# Patient Record
Sex: Male | Born: 1955
Health system: Southern US, Community
[De-identification: ages and names within clinical notes are randomized; demographics above are authoritative.]

## PROBLEM LIST (undated history)

## (undated) DIAGNOSIS — I82409 Acute embolism and thrombosis of unspecified deep veins of unspecified lower extremity: Secondary | ICD-10-CM

## (undated) DIAGNOSIS — I499 Cardiac arrhythmia, unspecified: Secondary | ICD-10-CM

## (undated) DIAGNOSIS — K2270 Barrett's esophagus without dysplasia: Secondary | ICD-10-CM

## (undated) DIAGNOSIS — E7800 Pure hypercholesterolemia, unspecified: Secondary | ICD-10-CM

## (undated) DIAGNOSIS — I2699 Other pulmonary embolism without acute cor pulmonale: Secondary | ICD-10-CM

## (undated) DIAGNOSIS — K219 Gastro-esophageal reflux disease without esophagitis: Secondary | ICD-10-CM

## (undated) DIAGNOSIS — K449 Diaphragmatic hernia without obstruction or gangrene: Secondary | ICD-10-CM

## (undated) DIAGNOSIS — M1990 Unspecified osteoarthritis, unspecified site: Secondary | ICD-10-CM

## (undated) HISTORY — PX: LASIK: SHX215

## (undated) HISTORY — DX: Diaphragmatic hernia without obstruction or gangrene: K44.9

## (undated) HISTORY — PX: FRACTURE SURGERY: SHX138

## (undated) HISTORY — PX: SHOULDER ARTHROSCOPY: SHX128

## (undated) HISTORY — DX: Barrett's esophagus without dysplasia: K22.70

## (undated) HISTORY — PX: TOTAL KNEE ARTHROPLASTY: SHX125

## (undated) HISTORY — DX: Unspecified osteoarthritis, unspecified site: M19.90

## (undated) HISTORY — PX: KNEE ARTHROSCOPY: SUR90

## (undated) HISTORY — PX: TOE AMPUTATION: SHX809

---

## 2000-03-23 ENCOUNTER — Encounter: Payer: Self-pay | Admitting: Internal Medicine

## 2000-04-24 ENCOUNTER — Encounter (INDEPENDENT_AMBULATORY_CARE_PROVIDER_SITE_OTHER): Payer: Self-pay | Admitting: *Deleted

## 2000-04-24 ENCOUNTER — Ambulatory Visit (HOSPITAL_COMMUNITY): Admission: RE | Admit: 2000-04-24 | Discharge: 2000-04-24 | Payer: Self-pay | Admitting: *Deleted

## 2000-05-17 ENCOUNTER — Encounter: Payer: Self-pay | Admitting: Internal Medicine

## 2000-05-18 ENCOUNTER — Ambulatory Visit (HOSPITAL_COMMUNITY)
Admission: RE | Admit: 2000-05-18 | Discharge: 2000-05-18 | Payer: Self-pay | Admitting: Physical Medicine and Rehabilitation

## 2000-05-18 ENCOUNTER — Encounter: Payer: Self-pay | Admitting: Physical Medicine and Rehabilitation

## 2001-02-12 ENCOUNTER — Emergency Department (HOSPITAL_COMMUNITY): Admission: EM | Admit: 2001-02-12 | Discharge: 2001-02-12 | Payer: Self-pay | Admitting: Emergency Medicine

## 2001-10-05 ENCOUNTER — Emergency Department (HOSPITAL_COMMUNITY): Admission: AC | Admit: 2001-10-05 | Discharge: 2001-10-06 | Payer: Self-pay

## 2001-10-06 ENCOUNTER — Encounter: Payer: Self-pay | Admitting: Emergency Medicine

## 2001-11-25 ENCOUNTER — Encounter: Payer: Self-pay | Admitting: Internal Medicine

## 2001-12-11 ENCOUNTER — Ambulatory Visit (HOSPITAL_COMMUNITY): Admission: RE | Admit: 2001-12-11 | Discharge: 2001-12-11 | Payer: Self-pay | Admitting: *Deleted

## 2001-12-11 ENCOUNTER — Encounter (INDEPENDENT_AMBULATORY_CARE_PROVIDER_SITE_OTHER): Payer: Self-pay | Admitting: *Deleted

## 2001-12-11 ENCOUNTER — Encounter (INDEPENDENT_AMBULATORY_CARE_PROVIDER_SITE_OTHER): Payer: Self-pay | Admitting: Specialist

## 2002-07-08 ENCOUNTER — Encounter: Payer: Self-pay | Admitting: Internal Medicine

## 2006-02-22 ENCOUNTER — Ambulatory Visit: Payer: Self-pay | Admitting: Family Medicine

## 2006-03-12 ENCOUNTER — Ambulatory Visit: Payer: Self-pay | Admitting: Family Medicine

## 2006-03-28 ENCOUNTER — Ambulatory Visit: Payer: Self-pay | Admitting: Cardiovascular Disease

## 2006-04-02 ENCOUNTER — Encounter: Payer: Self-pay | Admitting: Internal Medicine

## 2006-04-27 ENCOUNTER — Encounter (INDEPENDENT_AMBULATORY_CARE_PROVIDER_SITE_OTHER): Payer: Self-pay | Admitting: Specialist

## 2006-04-27 ENCOUNTER — Ambulatory Visit (HOSPITAL_COMMUNITY): Admission: RE | Admit: 2006-04-27 | Discharge: 2006-04-27 | Payer: Self-pay | Admitting: *Deleted

## 2006-04-27 ENCOUNTER — Encounter (INDEPENDENT_AMBULATORY_CARE_PROVIDER_SITE_OTHER): Payer: Self-pay | Admitting: *Deleted

## 2006-05-14 ENCOUNTER — Ambulatory Visit: Payer: Self-pay | Admitting: Cardiovascular Disease

## 2007-08-08 ENCOUNTER — Ambulatory Visit: Payer: Self-pay | Admitting: Family Medicine

## 2007-09-12 ENCOUNTER — Ambulatory Visit: Payer: Self-pay | Admitting: Family Medicine

## 2007-09-18 ENCOUNTER — Ambulatory Visit: Payer: Self-pay | Admitting: Family Medicine

## 2009-02-03 ENCOUNTER — Ambulatory Visit: Payer: Self-pay | Admitting: Family Medicine

## 2009-05-10 ENCOUNTER — Ambulatory Visit: Payer: Self-pay | Admitting: Family Medicine

## 2009-05-13 ENCOUNTER — Ambulatory Visit: Payer: Self-pay | Admitting: Family Medicine

## 2009-05-13 ENCOUNTER — Encounter: Admission: RE | Admit: 2009-05-13 | Discharge: 2009-05-13 | Payer: Self-pay | Admitting: Family Medicine

## 2009-08-16 ENCOUNTER — Ambulatory Visit: Payer: Self-pay | Admitting: Family Medicine

## 2010-03-23 ENCOUNTER — Ambulatory Visit: Payer: Self-pay | Admitting: Family Medicine

## 2010-04-13 ENCOUNTER — Encounter (INDEPENDENT_AMBULATORY_CARE_PROVIDER_SITE_OTHER): Payer: Self-pay | Admitting: *Deleted

## 2010-04-19 ENCOUNTER — Encounter: Payer: Self-pay | Admitting: Internal Medicine

## 2010-04-19 ENCOUNTER — Ambulatory Visit
Admission: RE | Admit: 2010-04-19 | Discharge: 2010-04-19 | Payer: Self-pay | Source: Home / Self Care | Attending: Family Medicine | Admitting: Family Medicine

## 2010-04-19 ENCOUNTER — Encounter (INDEPENDENT_AMBULATORY_CARE_PROVIDER_SITE_OTHER): Payer: Self-pay | Admitting: *Deleted

## 2010-04-20 DIAGNOSIS — R109 Unspecified abdominal pain: Secondary | ICD-10-CM | POA: Insufficient documentation

## 2010-04-20 DIAGNOSIS — K219 Gastro-esophageal reflux disease without esophagitis: Secondary | ICD-10-CM | POA: Insufficient documentation

## 2010-04-20 DIAGNOSIS — K648 Other hemorrhoids: Secondary | ICD-10-CM | POA: Insufficient documentation

## 2010-04-20 DIAGNOSIS — K2270 Barrett's esophagus without dysplasia: Secondary | ICD-10-CM | POA: Insufficient documentation

## 2010-04-20 DIAGNOSIS — R197 Diarrhea, unspecified: Secondary | ICD-10-CM | POA: Insufficient documentation

## 2010-04-21 ENCOUNTER — Ambulatory Visit
Admission: RE | Admit: 2010-04-21 | Discharge: 2010-04-21 | Payer: Self-pay | Source: Home / Self Care | Attending: Internal Medicine | Admitting: Internal Medicine

## 2010-04-21 ENCOUNTER — Encounter: Payer: Self-pay | Admitting: Internal Medicine

## 2010-04-21 DIAGNOSIS — R7401 Elevation of levels of liver transaminase levels: Secondary | ICD-10-CM | POA: Insufficient documentation

## 2010-04-21 DIAGNOSIS — R740 Nonspecific elevation of levels of transaminase and lactic acid dehydrogenase [LDH]: Secondary | ICD-10-CM

## 2010-04-21 DIAGNOSIS — M129 Arthropathy, unspecified: Secondary | ICD-10-CM | POA: Insufficient documentation

## 2010-04-26 ENCOUNTER — Ambulatory Visit
Admission: RE | Admit: 2010-04-26 | Discharge: 2010-04-26 | Payer: Self-pay | Source: Home / Self Care | Attending: Internal Medicine | Admitting: Internal Medicine

## 2010-04-26 ENCOUNTER — Encounter: Payer: Self-pay | Admitting: Internal Medicine

## 2010-04-26 ENCOUNTER — Other Ambulatory Visit: Payer: Self-pay | Admitting: Internal Medicine

## 2010-04-26 LAB — IGA: IgA: 371 mg/dL (ref 68–378)

## 2010-04-27 ENCOUNTER — Telehealth: Payer: Self-pay | Admitting: Internal Medicine

## 2010-04-27 ENCOUNTER — Telehealth (INDEPENDENT_AMBULATORY_CARE_PROVIDER_SITE_OTHER): Payer: Self-pay | Admitting: *Deleted

## 2010-04-29 ENCOUNTER — Telehealth: Payer: Self-pay | Admitting: Internal Medicine

## 2010-05-02 ENCOUNTER — Encounter: Payer: Self-pay | Admitting: Internal Medicine

## 2010-05-02 ENCOUNTER — Telehealth: Payer: Self-pay | Admitting: Internal Medicine

## 2010-05-03 ENCOUNTER — Telehealth (INDEPENDENT_AMBULATORY_CARE_PROVIDER_SITE_OTHER): Payer: Self-pay | Admitting: *Deleted

## 2010-05-04 ENCOUNTER — Ambulatory Visit
Admission: RE | Admit: 2010-05-04 | Discharge: 2010-05-04 | Payer: Self-pay | Source: Home / Self Care | Attending: Internal Medicine | Admitting: Internal Medicine

## 2010-05-06 ENCOUNTER — Encounter: Payer: Self-pay | Admitting: Internal Medicine

## 2010-05-09 ENCOUNTER — Ambulatory Visit: Admit: 2010-05-09 | Payer: Self-pay | Admitting: Internal Medicine

## 2010-05-11 ENCOUNTER — Telehealth: Payer: Self-pay | Admitting: Internal Medicine

## 2010-05-11 ENCOUNTER — Ambulatory Visit (HOSPITAL_COMMUNITY)
Admission: RE | Admit: 2010-05-11 | Discharge: 2010-05-11 | Payer: Self-pay | Source: Home / Self Care | Attending: Internal Medicine | Admitting: Internal Medicine

## 2010-05-13 ENCOUNTER — Other Ambulatory Visit: Payer: Self-pay | Admitting: Internal Medicine

## 2010-05-13 ENCOUNTER — Ambulatory Visit
Admission: RE | Admit: 2010-05-13 | Discharge: 2010-05-13 | Payer: Self-pay | Source: Home / Self Care | Attending: Internal Medicine | Admitting: Internal Medicine

## 2010-05-19 NOTE — Op Note (Signed)
Summary: EGD/bx                    [facility]  Patient:    Russell Sanford, Russell Sanford                        MRN: [id number] Proc. Date: 04/24/00 Adm. Date:  [id number] Attending:  Sabino Gasser CC:         Ronnald Nian, M.D.             Benny Lennert, M.D.                           Procedure Report  PROCEDURE PERFORMED:  Upper endoscopy.  INDICATIONS:  Reflux symptomatology.  See my clinical note dated March 23, 2000.  DESCRIPTION OF PROCEDURE:   With the patient mildly sedated in the left lateral decubitus position, the Olympus videoscopic endoscope was inserted into the mouth, passed under direct vision through the esophagus.  Distal esophagus was approached and showed a hiatal hernia and Barretts esophagus with esophagitis seen as evidenced by flames of erythematous tissue extending cephalad into the esophagus.  This was photographed and multiple biopsies were taken.  We entered into the stomach through the hiatal hernia sac.  Fundus body, antrum, duodenal bulb, second portion of the duodenum were all well visualized and appeared normal.  Photographs were taken.  From this point, the endoscope was slowly withdrawn, taking circumferential views of the entire duodenal mucosa until the endoscope was pulled back into the stomach, placed on retroflexion to view the stomach from below and a hiatal hernia was seen from below and photographed.  This is evidenced by incomplete wrap of the GE junction around the endoscope.  The endoscope was then straightened and withdrawn, taking circumferential views of the entire gastric and subsequently esophageal mucosa which otherwise appeared normal.  The patients vital signs and pulse oximetry remained stable.  The patient tolerated the procedure well with no apparent complications.  FINDINGS:  Hiatal hernia and Barretts esophagus above this, biopsied.  Await biopsy report.  PLAN:  The patient states he continues to have symptoms  on Nexium of belching and reflux of material; however, it is less acidic and less noxious on medications and will plan on continuing that and discuss this further with him when we follow up as an outpatient.  Proceed to colonoscopy as planned. DD:  04/24/00 TD:  04/24/00 Job: [id number]      FINAL DIAGNOSIS    ***MICROSCOPIC EXAMINATION AND DIAGNOSIS***    I. ESOPHAGUS: SQUAMOUS AND GASTRIC CARDIA MUCOSA WITH MILD TO   MODERATE CHRONIC INFLAMMATION (BIOPSY OF ESOPHAGOGASTRIC   JUNCTION). NO INTESTINAL METAPLASIA, DYSPLASIA OR MALIGNANCY   IDENTIFIED.   II. SMALL BOWEL BIOPSY: UNREMARKABLE SMALL BOWEL MUCOSA. NO   VILLOUS ATROPHY, INFLAMMATION OR OTHER ABNORMALITIES PRESENT.   (BIOPSY, TERMINAL ILIEUM)    III. COLON, BIOPSY: UNREMARKABLE COLONIC MUCOSA. NO SIGNIFICANT   INFLAMMATION OR OTHER ABNORMALITIES IDENTIFIED. (BIOPSIES,   RANDOM)    JO ANN SHAW MD    COMMENT   I. An Alcian Blue stain is performed to determine the presence   of intestinal metaplasia in this surgical pathology material. No   intestinal metaplasia is identified with Alcian Blue stain. The   control stained appropriately. There is benign gastric cardia and   a small amount of benign squamous mucosa which show benign   reactive changes but is  otherwise unremarkable. Mild to   moderate chronic inflammation is present in the submucosa. The   findings may indicate mild esophagogastritis but clinical   correlation is recommended. JAS:MW 2-9    II. There is small bowel mucosa with normal villous architecture   and no objective increase in inflammation. No villous atrophy,   active inflammation or other significant changes identified.    III. There is colonic mucosa with normal crypt architecture and   no objective increase in inflammation. No active inflammation,   microscopic colitis, collagenous colitis or significant chronic   changes identified. No hyperplastic or adenomatous changes are   seen,  and there is no evidence of malignancy.    mw   Date Reported: 04/25/2000 Berneta Levins, MD   *** Electronically Signed Out By JAS ***    Clinical information   R/O BARRETT' S (JC)    specimen(s) obtained   1: EG JUNCTION   2: TERMINAL ILEUM   3: RANDOM COLON    Gross Description   I. Received in formalin is a tan, soft tissue fragment that is   submitted in toto. Size: 0.3 cm    II. Received in formalin are tan, soft tissue fragments that are   submitted in toto. Number: 2   Size: 0.3 cm    III. Received in formalin are tan, soft tissue fragments that   are submitted in toto. Number: 5   Size: 0.1 to 0.3 cm (GP:gt) 04/24/00    gdt/

## 2010-05-19 NOTE — Letter (Signed)
Summary: [facility]   Imported By: Sherian Rein 05/06/2010 15:09:43  _____________________________________________________________________  External Attachment:    Type:   Image     Comment:   External Document

## 2010-05-19 NOTE — Progress Notes (Signed)
Summary: Questions  Phone Note Call from Patient Call back at Home Phone [phone] Call back at [phone]   Caller: Patient Call For: Dr. Juanda Chance Reason for Call: Talk to Nurse Summary of Call: Pt wants to speak with [facility] about lab results and also has other questions Initial call taken by: [place] Johnson,  April 29, 2010 12:10 PM  Follow-up for Phone Call        Patient calling to see if his path results are back yet. He will call back Monday if he has not heard from [place].  Follow-up by: Jesse Fall RN,  April 29, 2010 1:51 PM     Appended Document: Questions I have spoken to the pt. awaiting biopsy results.

## 2010-05-19 NOTE — Letter (Signed)
Summary: [facility]   Imported By: Sherian Rein 05/06/2010 15:12:06  _____________________________________________________________________  External Attachment:    Type:   Image     Comment:   External Document

## 2010-05-19 NOTE — Letter (Signed)
Summary: [facility] Instructions  [facility]  [address] [place], [place] [address]   Phone: [phone]  Fax: [phone]       JILBERTO VANDERWALL    [birth year]    MRN: [id number]       Procedure Day /Date: Tuesday 04/26/10     Arrival Time: 1:30 pm     Procedure Time: 2:30 pm     Location of Procedure:                    _x _  [facility] (4th Floor)  PREPARATION FOR COLONOSCOPY WITH MIRALAX  Starting 5 days prior to your procedure 04/21/10 do not eat nuts, seeds, popcorn, corn, beans, peas,  salads, or any raw vegetables.  Do not take any fiber supplements (e.g. Metamucil, Citrucel, and Benefiber). ____________________________________________________________________________________________________   THE DAY BEFORE YOUR PROCEDURE         DATE: 04/25/10 DAY: Monday  1   Drink clear liquids the entire day-NO SOLID FOOD  2   Do not drink anything colored red or purple.  Avoid juices with pulp.  No orange juice.  3   Drink at least 64 oz. (8 glasses) of fluid/clear liquids during the day to prevent dehydration and help the prep work efficiently.  CLEAR LIQUIDS INCLUDE: Water Jello Ice Popsicles Tea (sugar ok, no milk/cream) Powdered fruit flavored drinks Coffee (sugar ok, no milk/cream) Gatorade Juice: apple, white grape, white cranberry  Lemonade Clear bullion, consomm, broth Carbonated beverages (any kind) Strained chicken noodle soup Hard Candy  4   Mix the entire bottle of Miralax with 64 oz. of Gatorade/Powerade in the morning and put in the refrigerator to chill.  5   At 3:00 pm take 2 Dulcolax/Bisacodyl tablets.  6   At 4:30 pm take one Reglan/Metoclopramide tablet.  7  Starting at 5:00 pm drink one 8 oz glass of the Miralax mixture every 15-20 minutes until you have finished drinking the entire 64 oz.  You should finish drinking prep around 7:30 or 8:00 pm.  8   If you are nauseated, you may take the 2nd Reglan/Metoclopramide tablet at  6:30 pm.        9    At 8:00 pm take 2 more DULCOLAX/Bisacodyl tablets.        THE DAY OF YOUR PROCEDURE      DATE:  04/26/10 DAY: Tuesday  You may drink clear liquids until  12:30 pm (2 HOURS BEFORE PROCEDURE).   MEDICATION INSTRUCTIONS  Unless otherwise instructed, you should take regular prescription medications with a small sip of water as early as possible the morning of your procedure.       OTHER INSTRUCTIONS  You will need a responsible adult at least 57 years of age to accompany you and drive you home.   This person must remain in the waiting room during your procedure.  Wear loose fitting clothing that is easily removed.  Leave jewelry and other valuables at home.  However, you may wish to bring a book to read or an iPod/MP3 player to listen to music as you wait for your procedure to start.  Remove all body piercing jewelry and leave at home.  Total time from sign-in until discharge is approximately 2-3 hours.  You should go home directly after your procedure and rest.  You can resume normal activities the day after your procedure.  The day of your procedure you should not:   Drive   Make legal  decisions   Operate machinery   Drink alcohol   Return to work  You will receive specific instructions about eating, activities and medications before you leave.   The above instructions have been reviewed and explained to me by   _______________________    I fully understand and can verbalize these instructions _____________________________ Date _______

## 2010-05-19 NOTE — Assessment & Plan Note (Signed)
Summary: APPT AT 10:45AM/YF   History of Present Illness Visit Type: Initial Consult Primary GI MD: Lina Sar MD Primary Provider: August Albino, MD Requesting Provider: Ronnald Nian, MD  Chief Complaint: Generalized abd pain, diarrhea, low back pain, and GERD  History of Present Illness:   This is a 55 year old white male with a two-week history of severe diarrhea and an at least 6 or 7 month history of loose stools. He woke up on Christmas Day with crampy abdominal pain and watery stools. and fever of 101F.He was evaluated in the  emergency room with a CT scan. which showed diffuse colitis, predominantly within the right colon, cecum and transverse colon. He was started on Cipro and Flagyl which he has almost completed without significant improvement of his diarrhea. He initially had watery stools several times per night as well as during the day but the nocturnal diarrhea has now subsided. The fever has subsided as well. His stool studies are positive for lactoferrin. His white cell count was normal at 9.6 and his sedimentation rate was normal at 10. His tissue transglutaminase IgA was slightly positive at 21 but tTG IgG is normal at 15. His liver function tests have been abnormal while on Crestor with an AST of 103, ALT of 161 and bilirubin of 1.0. His alkaline phosphatase was 171. He has been on Celebrex 200 mg twice a day for polyarthralgias. He was under the care of Dr.Orr in the past and had colonoscopies and upper endoscopies with findings of Barrett's esophagus. His last exam was 5 years ago.   GI Review of Systems    Reports abdominal pain, acid reflux, and  heartburn.     Location of  Abdominal pain: generalized.    Denies belching, bloating, chest pain, dysphagia with liquids, dysphagia with solids, loss of appetite, nausea, vomiting, vomiting blood, weight loss, and  weight gain.      Reports diarrhea.     Denies anal fissure, black tarry stools, change in bowel habit,  constipation, diverticulosis, fecal incontinence, heme positive stool, hemorrhoids, irritable bowel syndrome, jaundice, light color stool, liver problems, rectal bleeding, and  rectal pain.    Current Medications (verified): 1)  Celebrex 200 Mg Caps (Celecoxib) .... One Capsule By Mouth Two Times A Day 2)  Crestor 10 Mg Tabs (Rosuvastatin Calcium) .... One Tablet By Mouth Once Daily 3)  Prilosec Otc 20 Mg Tbec (Omeprazole Magnesium) .... As Needed  Allergies (verified): 1)  ! Sulfa  Past History:  Past Medical History: HYPERLIPIDEMIA (ICD-272.4) ARTHRITIS (ICD-716.90) DIARRHEA (ICD-787.91) ABDOMINAL PAIN, UNSPECIFIED SITE (ICD-789.00) BARRETT'S ESOPHAGUS, HX OF (ICD-V12.79) GERD (ICD-530.81) INTERNAL HEMORRHOIDS (ICD-455.0) Presumed Colitis per CT scan   Past Surgical History: Knee Surgery  Shoulder Scope  Family History: Family History of Heart Disease: Father Family History of Kidney Disease: Brother (Kidney CA) that spread ? Colon CA   Social History: VP Sales Married Childern Alcohol Use - yes-on weekends Illicit Drug Use - no Patient gets regular exercise. Patient has never smoked.  Daily Caffeine Use: 2 daily  Smoking Status:  never  Review of Systems       The patient complains of arthritis/joint pain and back pain.  The patient denies allergy/sinus, anemia, anxiety-new, blood in urine, breast changes/lumps, change in vision, confusion, cough, coughing up blood, depression-new, fainting, fatigue, fever, headaches-new, hearing problems, heart murmur, heart rhythm changes, itching, menstrual pain, muscle pains/cramps, night sweats, nosebleeds, pregnancy symptoms, shortness of breath, skin rash, sleeping problems, sore throat, swelling of feet/legs, swollen  lymph glands, thirst - excessive , urination - excessive , urination changes/pain, urine leakage, vision changes, and voice change.         Pertinent positive and negative review of systems were noted in the above  HPI. All other ROS was otherwise negative.   Vital Signs:  Patient profile:   55 year old male Height:      73 inches Weight:      264 pounds BMI:     34.96 BSA:     2.42 Pulse rate:   72 / minute Pulse rhythm:   regular BP sitting:   136 / 84  (left arm) Cuff size:   regular  Vitals Entered By: Ok Anis CMA (April 21, 2010 10:49 AM)  Physical Exam  General:  alert, oriented and in no distress. Eyes:  nonicteric. Mouth:  normal oral mucosa with no ulcers. Neck:  Supple; no masses or thyromegaly. Lungs:  Clear throughout to auscultation. Heart:  Regular rate and rhythm; no murmurs, rubs,  or bruits. Abdomen:  soft abdomen without distention. Diffusely tender along the left transverse and right colon without rebound. Bowel sounds are slightly decreased. Liver edge at costal margin. No fluid wave. No CVA tenderness. Rectal:  normal rectal sphincter tone, small amount of  Hemoccult positive stool. Extremities:  no edema. Skin:  no rash. Dupuytren's contractures left-hand. Inguinal Nodes:  no inguinal adenopathy. Psych:  Alert and cooperative. Normal mood and affect.   Impression & Recommendations:  Problem # 1:  DIARRHEA (ICD-787.91)  Patient has acute colitis of 2 weeks duration, most consistent with an infectious colitis. However, there has been no response to a combination of Cipro and Flagyl. The acute illness was proceeded by 6-7 months of diarrhea. We need to rule out early inflammatory bowel disease; specifically ulcerative colitis. He has been on high doses of Celebrex which can cause small bowel as well as colon ulcerations so I have asked him to discontinue Celebrex and have instead put him on on Vicodin 1 tablet every 4-6 hours as needed for joint pains. We will start him empirically on Asacol HD, 1 tablet 3 times a day to a total of 2.4 g a day for his colitis while he is awaiting a colonoscopy which would be done early next week. I have asked him to stay on a  low-residue diet and take Imodium on p.r.n. basis We will switch him to Augmentin 875 mg daily in place of Cipro and Flagyl. He is on a probiotic and I asked him to continue that.  Orders: Colonoscopy (Colon)  Problem # 2:  BARRETT'S ESOPHAGUS, HX OF (ICD-V12.79) We will obtain records of his prior endoscopies and colonoscopies. He is to continue on his Prilosec 20 mg daily. We will obtain a sprue profile to get a complete assessment of his positive fat and tissue transglutaminase levels.  Problem # 3:  NONSPEC ELEVATION OF LEVELS OF TRANSAMINASE/LDH (ICD-790.4) Discontinue Crestor for now as well as Celebrex due to the fact that it could be a reactive liver abnormality secondary to infection.  Patient Instructions: 1)  You have been scheduled for a colonoscopy. Please follow written prep instructions that were given to you today at your visit.  2)  Please pick up your prescriptions at the pharmacy. Electronic prescription(s) has already been sent for Vicodin. 3)  Please pick up your prescription for Miralax, Dulcolax and Reglan at the pharmacy. An electronic presription has already been sent.  4)  We have given you samples of  Asacol HD 800 mg tablets to take until we can perform your Colonoscopy. You should take 3 tablets daily. 5)   Wewill obtain records fron Dr Virginia Rochester. 6)  We have also sent a prescription for Augmentin 875 mg p.o. q.d. in place of Cipro and Flagyl. 7)  Patient is to follow a low residue diet. A diet was mailed to patient's home address. 8)  Complete sprue panel at time of colonoscopy. 9)  Copy sent to : Dr Malva Limes 10)  The medication list was reviewed and reconciled.  All changed / newly prescribed medications were explained.  A complete medication list was provided to the patient / caregiver. Prescriptions: AUGMENTIN 875-125 MG TABS (AMOXICILLIN-POT CLAVULANATE) Take 1 tablet by mouth once daily x 10 days  #10 x 0   Entered by:   Lamona Curl CMA (AAMA)    Authorized by:   Hart Carwin MD   Signed by:   Lamona Curl CMA (AAMA) on 04/21/2010   Method used:   Electronically to        [facility]. [phone]* (retail)       [phone]       [place], [place]  [address]       Ph: [phone]       Fax: [phone]   RxID:   [id number] REGLAN 10 MG  TABS (METOCLOPRAMIDE HCL) As per prep instructions.  #2 x 0   Entered by:   Lamona Curl CMA (AAMA)   Authorized by:   Hart Carwin MD   Signed by:   Lamona Curl CMA (AAMA) on 04/21/2010   Method used:   Electronically to        [facility]. [phone]* (retail)       [phone] [address]       [place], [place]  [address]       Ph: [phone]       Fax: [phone]   RxID:   [id number] DULCOLAX 5 MG  TBEC (BISACODYL) Day before procedure take 2 at 3pm and 2 at 8pm.  #4 x 0   Entered by:   Lamona Curl CMA (AAMA)   Authorized by:   Hart Carwin MD   Signed by:   Lamona Curl CMA (AAMA) on 04/21/2010   Method used:   Electronically to        [facility]. [phone]* (retail)       [phone] [address].       [place], [place]  [address]       Ph: [phone]       Fax: [phone]   RxID:   [id number] MIRALAX   POWD (POLYETHYLENE GLYCOL 3350) As per prep  instructions.  #255gm x 0   Entered by:   Lamona Curl CMA (AAMA)   Authorized by:   Hart Carwin MD   Signed by:   Lamona Curl CMA (AAMA) on 04/21/2010   Method used:   Electronically to        [facility]. [phone]* (retail)       [phone]       [place], [place]  [address]       Ph: [phone]       Fax: [phone]   RxID:   [id number] Haskell Flirt  5-500 MG TABS (HYDROCODONE-ACETAMINOPHEN) Take 1 tablet by mouth every 4-6 hours as neeed for pain  #40 x 0   Entered by:   Lamona Curl CMA (AAMA)   Authorized by:   Hart Carwin MD   Signed by:   Lamona Curl CMA (AAMA) on 04/21/2010   Method used:   Printed then faxed to ...       [facility]. [phone]* (retail)       [phone] [address].       [place], [place]  [address]       Ph: [phone]       Fax: [phone]   RxID:   [id number]

## 2010-05-19 NOTE — Op Note (Signed)
Summary: COLON (Dr Virginia Rochester)  NAME:  Russell Sanford, BRAU               ACCOUNT NO.:  [id number]   MEDICAL RECORD NO.:  [id number]          PATIENT TYPE:  AMB   LOCATION:  ENDO                         FACILITY:  [facility]   PHYSICIAN:  Georgiana Spinner, M.D.    DATE OF BIRTH:  1955-08-16   DATE OF PROCEDURE:  04/27/2006  DATE OF DISCHARGE:                               OPERATIVE REPORT   SURGEON:  Georgiana Spinner, M.D.   PROCEDURE:  Colonoscopy.   INDICATIONS:  Colon cancer screening.   ANESTHESIA:  Demerol 20 mg, Versed 1 mg.   DESCRIPTION OF PROCEDURE:  With the patient mildly sedated in the left  lateral decubitus position, a rectal exam was performed, which was  unremarkable to my examination.  Subsequently, the Pentax videoscopic  colonoscope was inserted into the rectum and passed under direct vision  to the cecum, identified by the ileocecal valve and appendiceal orifice,  both of which were photographed.  From this point, the colonoscope was  slowly withdrawn, taking circumferential views of colonic mucosa,  stopping only in the rectum, which appeared, and the rectum showed  hemorrhoids on retroflex view.  The endoscope was then straightened and  withdrawn.  The patient's vital signs and pulse oximetry remained  stable.  The patient tolerated the procedure well and without apparent  complications.   FINDINGS:  Internal hemorrhoids, otherwise, a normal colonoscopic  examination.   PLAN:  Consider repeat examination in 5 to 10 years.           ______________________________  Georgiana Spinner, M.D.     GMO/MEDQ  D:  04/27/2006  T:  04/27/2006  Job:  [id number]   cc:   Sharlot Gowda, M.D.

## 2010-05-19 NOTE — Progress Notes (Addendum)
Summary: done with med  Phone Note Call from Patient Call back at Home Phone [phone]   Caller: Patient Call For: Dr Juanda Chance Reason for Call: Talk to Nurse Summary of Call: Patient states that he has no more Prednisone left and was told to call when he was done with it. Initial call taken by: Tawni Levy,  May 11, 2010 10:49 AM  Follow-up for Phone Call        Spoke with patient. He completed his Prednisone taper yesterday. States he is not having the real bad diarrhea anymore. Diarrhea 3-4 times/day that is "mushy like applesauce." States he just finished with the UGI. He is having pain with his arthritis and is wondering if he can restart the Celebrex. Please, advise. Follow-up by: Jesse Fall RN,  May 11, 2010 11:02 AM  Additional Follow-up for Phone Call Additional follow up Details #1::        I have left e message at pt's mobile  # to call back to discuss SBFT. He has a very rapid transit time though the small bowl ( less than 1 minute), which is what causes his diarrhea. It could be due to resolving, infection, hyperthyroidism, metabolic problem ( carcinoid etc). Don.t take any more Prednisone or any antibiotic. OK to take Imodium 1/day  As long as he is getting better, we may not do anything. However, if not  reasonably weel by end of the week, would check his TSH, serotoninlevels. May resume Celebrex 200mg  once daily x several days, if tolerated , he may increase to two times a day. Additional Follow-up by: Hart Carwin MD,  May 11, 2010 7:05 PM    Additional Follow-up for Phone Call Additional follow up Details #2::    Pt. said he was returning your call. He may be reached at [phone] Follow-up by: Karna Christmas,  May 12, 2010 8:13 AM  Additional Follow-up for Phone Call Additional follow up Details #3:: Details for Additional Follow-up Action Taken: Spoke with patient and gave him Dr. Regino Schultze recommendations. He states that if he eats at regular  times, he has more diarrhea (6-8 times/day). Stool is "mushy" now. States he had 8 stools yesterday. If he doesn't eat much, stools are 3-4 times/day. Spoke with pt 1.00pm, He will take Imodium 1 by mouth two times a day. Please call DR Lalond's office to obtain recent  TSH leve, if older than 1 year, please obtain TSH. Alsi in my absence next week, call to check on his condition and if not improved, set up for EGD and small bowl biopsy  the following week when I am the hospital doctor. Thanx  Additional Follow-up by: Jesse Fall RN,  May 12, 2010 8:26 AM   Appended Document: done with med Called Dr. Jola Babinski office and the patient did not have TSH in their office. Called patient and he  will come in tomorrow AM for labs. Flag sent to me to call patient next week to check on him.

## 2010-05-19 NOTE — Letter (Signed)
Summary: [facility]   Imported By: Sherian Rein 05/06/2010 15:13:06  _____________________________________________________________________  External Attachment:    Type:   Image     Comment:   External Document

## 2010-05-19 NOTE — Progress Notes (Signed)
  Phone Note Outgoing Call Call back at [phone]   Call placed by: Jesse Fall, RN Call placed to: Patient Summary of Call: Per Dr. Juanda Chance patient needs stool cultures for c&s, stool for C. diff. IF stool is liquid(totally no particles) stool for osmolaity-8705(per Shana), stool for electrolytes-81656(per Shana) and stool for osmotic gap- 17263x(per Shana in lab). Patient must collect the last 3 tests and freeze stool then return it immediately to lab. These tests may take 7-10 days to get results since they are sent out.  Per Dr. Juanda Chance- AFTER  patient has collected specimans needs to set up UGI for small bowel follow through. Message left for patient to call back on his cell phone. Jesse Fall RN  May 03, 2010 3:03 PM  Follow-up for Phone Call        Spoke with patient. He will come tomorrow and pick up specimen containers for stool collections. He will call me when he gets the samples returned so I can schedule his UGI. Follow-up by: Jesse Fall RN,  May 03, 2010 4:09 PM

## 2010-05-19 NOTE — Op Note (Signed)
Summary: EGD     Russell Sanford, MATHEWS                        ACCOUNT NO.:  [id number]   MEDICAL RECORD NO.:  [id number]                   PATIENT TYPE:  AMB   LOCATION:  ENDO                                 FACILITY:  [facility]   PHYSICIAN:  Georgiana Spinner, M.D.                 DATE OF BIRTH:  June 30, 1955   DATE OF PROCEDURE:  DATE OF DISCHARGE:                                 OPERATIVE REPORT   PROCEDURE:  Upper endoscopy.   INDICATIONS FOR PROCEDURE:  Follow-up of endoscopic Barrett's esophagus.   ANESTHESIA:  Demerol 80, Versed 8 mg.   DESCRIPTION OF PROCEDURE:  With the patient mildly sedated in the left  lateral decubitus position, the Olympus videoscopic endoscope was inserted  in the mouth and passed under direct vision through the esophagus which  appeared normal, photographs taken until we reached the distal esophagus. At  this point, there was not any clear evidence of Barrett's although there may  have been two small areas to be suspected and these were photographed and  biopsies were taken of these areas but this may have been a normal variant.  We entered into the stomach, the fundus, body, antrum, duodenal bulb and  second portion of the duodenum all appeared normal. From this point, the  endoscope was slowly withdrawn taking circumferential views of the entire  duodenal mucosa until the endoscope was then pulled back in the stomach,  placed in retroflexion to view the stomach from below. The endoscope was  then straightened and withdrawn taking circumferential views of the  remaining gastric and esophageal mucosa which otherwise appeared normal. The  patient's vital signs and pulse oximeter remained stable. The patient  tolerated the procedure well without apparent complications.   FINDINGS:  With a clear view of the distal esophagus, it is not clear that  he has Barrett's at this point endoscopically. If biopsies are negative  would relax his screening and I will  have patient call me for results of  biopsy and followup with me as an outpatient as needed.                                                 Georgiana Spinner, M.D.    GMO/MEDQ  D:  12/11/2001  T:  12/12/2001  Job:  [id number]   cc:   Ronnald Nian, M.D.  FINAL DIAGNOSIS      ***MICROSCOPIC EXAMINATION AND DIAGNOSIS***    ESOPHAGUS, BIOPSIES: FOCAL INTESTINAL METAPLASIA CONSISTENT WITH   BARRETT' S ESOPHAGUS.    COMMENT   An Alcian Blue stain is performed to determine the presence of   intestinal metaplasia. The Alcian Blue stain shows intestinal   metaplasia. The control stained appropriately. (JDP:caf   12/12/01)  cf   Date Reported: 12/12/2001 [name], MD   *** Electronically Signed Out By JDP ***    specimen(s) obtained   Esophagus, biopsy, Barrett's    Gross Description   Received in formalin are tan, soft tissue fragments that are   submitted in toto. Number: 2   Size: 0.2 and 0.3 cm. (JBM:jn, 12/11/01)    jn/

## 2010-05-19 NOTE — Letter (Signed)
Summary: [facility] Office Note  [facility] Office Note   Imported By: Lamona Curl CMA (AAMA) 04/20/2010 16:27:14  _____________________________________________________________________  External Attachment:    Type:   Image     Comment:   External Document

## 2010-05-19 NOTE — Progress Notes (Signed)
Summary: Triage  Phone Note Call from Patient Call back at Home Phone [phone] Call back at [phone]   Caller: Patient Call For: Dr. Juanda Chance Reason for Call: Talk to Nurse Summary of Call: Pt is calling because he still has not heard from his path report and he is out of vicodin and needs something to take  he is "stiff as a 2x4" Initial call taken by: [place] Johnson,  May 02, 2010 4:06 PM  Follow-up for Phone Call        Path is still not back.  I did call and speak with [facility] and  the case is signed out to Dr Colonel Bald, but it is still not completed.  They are going to look into why there has been a delay.  You prescribed  Vicodin #40 on 04/21/10.  Do you want to give him more vicodin.  According to your note you stopped his celebrex and started on the vicodin.  Left message for patient to call back Follow-up by: Darcey Nora RN, CGRN,  May 02, 2010 4:51 PM  Additional Follow-up for Phone Call Additional follow up Details #1::        I have spoken to the pt and apologized for not having path report available yet. I called him Vicodin 5/500 , #40, refill to  tel [phone] Additional Follow-up by: Hart Carwin MD,  May 02, 2010 5:43 PM     Appended Document: Triage    Clinical Lists Changes  Medications: Changed medication from VICODIN 5-500 MG TABS (HYDROCODONE-ACETAMINOPHEN) Take 1 tablet by mouth every 4-6 hours as neeed for pain to VICODIN 5-500 MG TABS (HYDROCODONE-ACETAMINOPHEN) Take 1 tablet by mouth every 4-6 hours as neeed for pain - Signed Rx of VICODIN 5-500 MG TABS (HYDROCODONE-ACETAMINOPHEN) Take 1 tablet by mouth every 4-6 hours as neeed for pain;  #40 x 0;  Signed;  Entered by: Lamona Curl CMA (AAMA);  Authorized by: Lamona Curl CMA (AAMA);  Method used: Historical    Prescriptions: VICODIN 5-500 MG TABS (HYDROCODONE-ACETAMINOPHEN) Take 1 tablet by mouth every 4-6 hours as neeed for pain  #40 x 0   Entered and Authorized by:    Lamona Curl CMA (AAMA)   Signed by:   Lamona Curl CMA (AAMA) on 05/04/2010   Method used:   Historical   RxID:   [id number]

## 2010-05-19 NOTE — Progress Notes (Signed)
  Phone Note Outgoing Call Call back at [phone]   Call placed by: Jesse Fall, RN Call placed to: Patient Summary of Call: Called patient to schedule his 2 week OV f/u colonoscopy. He states he has had diarrhea x4 this AM. Stool is watery, dark brown/black in color. No bright red blood seen per pt. Stomach cramps are constant. Imodium AD is not helping. Please, advise. Initial call taken by: Jesse Fall RN,  April 27, 2010 9:42 AM  Follow-up for Phone Call        I am waiting for thje biopsies to come back and also for sprue profile. Please put him on trial of Prednisone 20 mg by mouth once daily x 1 week., then 15mg  x 3 days, 10 mg x 3 days then 5 mg x 3 days then stop. I will be in touch by then, Follow-up by: Hart Carwin MD,  April 27, 2010 10:04 AM  Additional Follow-up for Phone Call Additional follow up Details #1::        Message left for patient to call back. Jesse Fall RN  April 27, 2010 10:27 AM Spoke with patient and gave him Dr. Regino Schultze recommendations. Rx to pharmacy Additional Follow-up by: Jesse Fall RN,  April 27, 2010 11:47 AM    New/Updated Medications: PREDNISONE 10 MG TABS (PREDNISONE) Take 20mg  by mouth daily x1 week,then 15 mg x 3 days, then 10 mg x 3 days, then 5 mg x 3 days Prescriptions: PREDNISONE 10 MG TABS (PREDNISONE) Take 20mg  by mouth daily x1 week,then 15 mg x 3 days, then 10 mg x 3 days, then 5 mg x 3 days  #25 x 0   Entered by:   Jesse Fall RN   Authorized by:   Hart Carwin MD   Signed by:   Jesse Fall RN on 04/27/2010   Method used:   Electronically to        [facility]. [phone]* (retail)       [phone] [address]       [place], [place]  [address]       Ph: [phone]       Fax: [phone]   RxID:   [id number]

## 2010-05-19 NOTE — Progress Notes (Signed)
Summary: Returning your call  Phone Note Call from Patient Call back at cell [phone]   Call For: Dr Juanda Chance Summary of Call: Returning your call Initial call taken by: Leanor Kail [facility],  April 27, 2010 11:31 AM  Follow-up for Phone Call        Spoke with patient see other phone note. Follow-up by: Jesse Fall RN,  April 27, 2010 11:47 AM

## 2010-05-19 NOTE — Letter (Signed)
Summary: [facility]   Imported By: Lamona Curl CMA (AAMA) 04/21/2010 12:19:53  _____________________________________________________________________  External Attachment:    Type:   Image     Comment:   External Document

## 2010-05-19 NOTE — Letter (Signed)
Summary: [facility]   Imported By: Sherian Rein 05/06/2010 15:11:11  _____________________________________________________________________  External Attachment:    Type:   Image     Comment:   External Document

## 2010-05-19 NOTE — Letter (Signed)
Summary: Sabino Gasser MD  Sabino Gasser MD   Imported By: Sherian Rein 05/06/2010 15:04:52  _____________________________________________________________________  External Attachment:    Type:   Image     Comment:   External Document

## 2010-05-19 NOTE — Op Note (Signed)
Summary: EGD (Dr Orr)/bx  NAME:  Russell Sanford, Russell Sanford               ACCOUNT NO.:  [id number]   MEDICAL RECORD NO.:  [id number]          PATIENT TYPE:  AMB   LOCATION:  ENDO                         FACILITY:  [facility]   PHYSICIAN:  Georgiana Spinner, M.D.    DATE OF BIRTH:  [birth year]   DATE OF PROCEDURE:  04/27/2006  DATE OF DISCHARGE:                               OPERATIVE REPORT   PROCEDURE:  Upper endoscopy.   ENDOSCOPIST:  Georgiana Spinner, M.D.   INDICATIONS:  GERD with Barrett's esophagus.   ANESTHESIA:  Demerol 80 mg, Versed 7.5 mg, Phenergan 12.5 mg.   PROCEDURE:  With the patient mildly sedated in the left lateral  decubitus position, the Pentax videoscopic endoscope was inserted into  the mouth and passed under direct vision through the esophagus, which  appeared normal until we reached distal esophagus, and there were some  small islands of Barrett's esophagus and 1 short arm of Barrett's  esophagus seen and biopsies were taken.  We entered into the stomach  through a hiatal hernia.  Fundus, body, antrum, duodenal bulb and second  portion of duodenum were visualized.  From this point, the endoscope was  slowly withdrawn, taking circumferential views of the duodenal mucosa  until the endoscope had been pulled back into the stomach and placed in  retroflexion to view the stomach from below.  The endoscope was then  straightened and withdrawn, taking circumferential views of the  remaining gastric and esophageal mucosa.  The patient's vital signs and  pulse oximetry remained stable.  The patient tolerated the procedure  well without apparent complication.   FINDINGS:  Areas of what appeared to be Barrett's esophagus, biopsied,  await biopsy report.  The patient will call me for results and follow up  with me as an outpatient.   PLAN:  Proceed to colonoscopy as planned.           ______________________________  Georgiana Spinner, M.D.     GMO/MEDQ  D:  04/27/2006  T:  04/27/2006   Job:  [id number]   cc:   Sharlot Gowda, M.D.   FINAL DIAGNOSIS    ***MICROSCOPIC EXAMINATION AND DIAGNOSIS***    ESOPHAGOGASTRIC JUNCTION MUCOSA WITH MILD INFLAMMATION. NO   INTESTINAL METAPLASIA, DYSPLASIA OR MALIGNANCY IDENTIFIED   (BIOPSY)    COMMENT   An Alcian Blue stain is performed to determine the presence of   intestinal metaplasia (goblet cell metaplasia). No intestinal   metaplasia (goblet cell metaplasia) is identified with the Alcian   Blue stain. The control stained appropriately. (BNS:jy) 04/30/06    jy   Date Reported: 04/30/2006 Havery Moros, MD   *** Electronically Signed Out By BNS ***    Clinical information   R/O Barrett' s dysplasia (as)    specimen(s) obtained   Esophagus, biopsy, distal    Gross Description   Received in formalin are tan, soft tissue fragments that are   submitted in toto. Number: Multiple   Size: 0.3 cm (SP:mj 04/27/06)    mj/

## 2010-05-19 NOTE — Procedures (Signed)
Summary: Colonoscopy  Patient: [name] Note: All result statuses are Final unless otherwise noted.  Tests: (1) Colonoscopy (COL)   COL Colonoscopy           DONE     [facility]     [address]     [place], [place]  [address]           COLONOSCOPY PROCEDURE REPORT           PATIENT:  Russell Sanford, Russell Sanford  MR#:  [id number]     BIRTHDATE:  February 23, 1956, 54 yrs. old  GENDER:  male     ENDOSCOPIST:  Hedwig Morton. Juanda Chance, MD     REF. BY:  Sharlot Gowda, M.D.     PROCEDURE DATE:  04/26/2010     PROCEDURE:  Colonoscopy [id number]     ASA CLASS:  Class II     INDICATIONS:  unexplained diarrhea acute diarrhea with pain and     fever, wgt loss x 3 weeks, not resposive to Flagyl/Cipro     prior colon 5 yrs ago was normal     MEDICATIONS:   Versed 10 mg, Fentanyl 100 mcg           DESCRIPTION OF PROCEDURE:   After the risks benefits and     alternatives of the procedure were thoroughly explained, informed     consent was obtained.  Digital rectal exam was performed and     revealed no rectal masses.   The LB [id number]-[id number] endoscope     was introduced through the anus and advanced to the cecum, which     was identified by both the appendix and ileocecal valve, without     limitations.  The quality of the prep was good, using MiraLax.     The instrument was then slowly withdrawn as the colon was fully     examined.     <<PROCEDUREIMAGES>>           FINDINGS:  Colitis was found. acute colitis rectum to hepatic     flexure, multiple abrasions of the mucosa, normal mucosa in     betwen, no discrete ulcers Random biopsies were obtained and sent     to pathology (see image1, image2, image3, image13, image12,     image11, image14, and image15).  The terminal ileum appeared     normal. With standard forceps, biopsy was obtained and sent to     pathology (see image9, image8, and image7).  normal cecum (see     image5 and image4).  normal rectum (see image16).   Retroflexed     views in the  rectum revealed no abnormalities.    The scope was     then withdrawn from the patient and the procedure completed.           COMPLICATIONS:  None     ENDOSCOPIC IMPRESSION:     1) Colitis     2) Normal terminal ileum     3) Normal cecum     4) Normal rectum     RECOMMENDATIONS:     1) Await pathology results     continue Augmentin till finished,     continue Asacal HD till finished     sprue panel today     continue to hold Celebrex     OV 2 weeks     REPEAT EXAM:  No           ______________________________     Hedwig Morton. Juanda Chance,  MD           CC:  Sharlot Gowda, M.D.           n.     eSIGNED:   Hedwig Morton. Fanta Wimberley at 04/26/2010 03:26 PM           Ramond Craver, [id number]  Note: An exclamation mark (!) indicates a result that was not dispersed into the flowsheet. Document Creation Date: 04/26/2010 3:26 PM _______________________________________________________________________  (1) Order result status: Final Collection or observation date-time: 04/26/2010 15:14 Requested date-time:  Receipt date-time:  Reported date-time:  Referring Physician:   Ordering Physician: Lina Sar [phone]) Specimen Source:  Source: Launa Grill Order Number: [id number] Lab site:   Appended Document: Colonoscopy Appointment scheduled with Dr. Juanda Chance on 05/09/10 at 8:30 AM. Patient aware.  Appended Document: Colonoscopy     Procedures Next Due Date:    Colonoscopy: 04/2020

## 2010-05-19 NOTE — Letter (Signed)
Summary: New Patient letter  [facility]  [address] [place], [place] [address]   Phone: [phone]  Fax: [phone]       04/19/2010 MRN: [id number]  [facility] [address] [place], [place]  [address]  Dear Russell Sanford,  Welcome to the Gastroenterology Division at [facility].    You are scheduled to see Dr.  Juanda Chance on 04-21-2010 at 10:45am on the 3rd floor at [facility], [address]  We ask that you try to arrive at our office 15 minutes prior to your appointment time to allow for check-in.  We would like you to complete the enclosed self-administered evaluation form prior to your visit and bring it with you on the day of your appointment.  We will review it with you.  Also, please bring a complete list of all your medications or, if you prefer, bring the medication bottles and we will list them.  Please bring your insurance card so that we may make a copy of it.  If your insurance requires a referral to see a specialist, please bring your referral form from your primary care physician.  Co-payments are due at the time of your visit and may be paid by cash, check or credit card.     Your office visit will consist of a consult with your physician (includes a physical exam), any laboratory testing he/she may order, scheduling of any necessary diagnostic testing (e.g. x-ray, ultrasound, CT-scan), and scheduling of a procedure (e.g. Endoscopy, Colonoscopy) if required.  Please allow enough time on your schedule to allow for any/all of these possibilities.    If you cannot keep your appointment, please call [phone] to cancel or reschedule prior to your appointment date.  This allows [place] the opportunity to schedule an appointment for another patient in need of care.  If you do not cancel or reschedule by 5 p.m. the business day prior to your appointment date, you will be charged a $50.00 late cancellation/no-show fee.    Thank you for  choosing  Gastroenterology for your medical needs.  We appreciate the opportunity to care for you.  Please visit [place] at our website  to learn more about our practice.                     Sincerely,                                                             The Gastroenterology Division

## 2010-05-19 NOTE — Op Note (Signed)
Summary: COLON (Dr Virginia Rochester)                    [facility]igha [facility]idegroom. [facility]  Patient:    Russell Sanford, Russell Sanford                        MRN: [id number] Proc. Date: 04/24/00 Adm. Date:  [id number] Attending:  Sabino Gasser CC:         Ronnald Nian, M.D.  Dr. Thomasena Edis   Procedure Report  PROCEDURE:  Colonoscopy.  INDICATIONS:  Heme-positive stools, diarrhea.  ANESTHESIA:  None further given.  See endoscopy.  DESCRIPTION OF PROCEDURE:  With patient mildly sedated in the left lateral decubitus position, a rectal examination was performed, which was unremarkable.  Subsequently the Olympus videoscopic colonoscope was inserted in the rectum and passed under direct vision to the cecum.  Cecum identified by ileocecal valve and appendiceal orifice, both of which were photographed and appeared normal.  We entered into the terminal ileum, and this appeared grossly normal, although there was some increased lymphoid tissue, which we photographed and biopsied.  Subsequently the colonoscope was then slowly withdrawn, taking circumferential views of the terminal ileum and subsequently colonic mucosa until the colonoscope was withdrawn all the way to the rectum. We took random colon biopsies along the way.  The rectum appeared normal in direct view and was unremarkable on retroflex view.  The endoscope was straightened and withdrawn.  The patients vital signs and pulse oximetry remained stable.  The patient tolerated the procedure well and without apparent complications.  FINDINGS:  Essentially negative colonoscopic examination.  PLAN:  Have patient follow up with me as an outpatient.  See endoscopy note for further details. DD:  04/24/00 TD:  04/24/00 Job: [id number]  Appended Document: COLON (Dr Virginia Rochester)    FINAL DIAGNOSIS    ***MICROSCOPIC EXAMINATION AND DIAGNOSIS***    I. ESOPHAGUS: SQUAMOUS AND GASTRIC CARDIA MUCOSA WITH MILD TO   MODERATE CHRONIC INFLAMMATION (BIOPSY OF ESOPHAGOGASTRIC  JUNCTION). NO INTESTINAL METAPLASIA, DYSPLASIA OR MALIGNANCY   IDENTIFIED.   II. SMALL BOWEL BIOPSY: UNREMARKABLE SMALL BOWEL MUCOSA. NO   VILLOUS ATROPHY, INFLAMMATION OR OTHER ABNORMALITIES PRESENT.   (BIOPSY, TERMINAL ILIEUM)    III. COLON, BIOPSY: UNREMARKABLE COLONIC MUCOSA. NO SIGNIFICANT   INFLAMMATION OR OTHER ABNORMALITIES IDENTIFIED. (BIOPSIES,   RANDOM)    JO ANN SHAW MD    COMMENT   I. An Alcian Blue stain is performed to determine the presence   of intestinal metaplasia in this surgical pathology material. No   intestinal metaplasia is identified with Alcian Blue stain. The   control stained appropriately. There is benign gastric cardia and   a small amount of benign squamous mucosa which show benign   reactive changes but is otherwise unremarkable. Mild to   moderate chronic inflammation is present in the submucosa. The   findings may indicate mild esophagogastritis but clinical   correlation is recommended. JAS:MW 2-9    II. There is small bowel mucosa with normal villous architecture   and no objective increase in inflammation. No villous atrophy,   active inflammation or other significant changes identified.    III. There is colonic mucosa with normal crypt architecture and   no objective increase in inflammation. No active inflammation,   microscopic colitis, collagenous colitis or significant chronic   changes identified. No hyperplastic or adenomatous changes are   seen, and there is no evidence of malignancy.    mw  Date Reported: 04/25/2000 Berneta Levins, MD   *** Electronically Signed Out By JAS ***    Clinical information   R/O BARRETT' S (JC)    specimen(s) obtained   1: EG JUNCTION   2: TERMINAL ILEUM   3: RANDOM COLON    Gross Description   I. Received in formalin is a tan, soft tissue fragment that is   submitted in toto. Size: 0.3 cm    II. Received in formalin are tan, soft tissue fragments that are   submitted in toto. Number: 2   Size:  0.3 cm    III. Received in formalin are tan, soft tissue fragments that   are submitted in toto. Number: 5   Size: 0.1 to 0.3 cm (GP:gt) 04/24/00    gdt/

## 2010-05-19 NOTE — Letter (Signed)
Summary: Patient Notice- Colon Biospy Results  [facility]  [address] [place], [place] [address]   Phone: [phone]  Fax: [phone]        May 02, 2010 MRN: [id number]    Russell Sanford [address] [place], [place]  [address]    Dear [name],  I am pleased to inform you that the biopsies taken during your recent colonoscopy did not show any evidence of cancer upon pathologic examination.The biopsies show normal colon and small bowl tissue.  Additional information/recommendations:  __No further action is needed at this time.  Please follow-up with      your primary care physician for your other healthcare needs.  _x_Please call [phone] to schedule a return visit to review      your condition.  _x_Continue with the treatment plan as outlined on the day of your      exam.  _x_You should have a repeat colonoscopy examination for this problem           in 10_ years.  Please call us if you are having persistent problems or have questions about your condition that have not been fully answered at this time.  Sincerely,  Hart Carwin MD   This letter has been electronically signed by your physician.  Appended Document: Patient Notice- Colon Biospy Results Letter Mailed

## 2010-05-20 ENCOUNTER — Telehealth (INDEPENDENT_AMBULATORY_CARE_PROVIDER_SITE_OTHER): Payer: Self-pay | Admitting: *Deleted

## 2010-06-02 NOTE — Progress Notes (Signed)
  Phone Note Outgoing Call Call back at [phone]   Summary of Call: Message left for patient to call back and let me know how he is doing. Jesse Fall RN  May 20, 2010 8:12 AM  Follow-up for Phone Call        Spoke with patient to f/u on how he is doing. He states his stools are normal now. He states that he had one episode of stomach pain after eating earlier this week and after 15 minutes it was gone. Follow-up by: Jesse Fall RN,  May 20, 2010 11:02 AM  Additional Follow-up for Phone Call Additional follow up Details #1::        I am delighted. He ought to taper off his meds one at a time. and call back as needed. Additional Follow-up by: Hart Carwin MD,  May 20, 2010 1:57 PM    Additional Follow-up for Phone Call Additional follow up Details #2::    Message left for patient to call back. Follow-up by: Jesse Fall RN,  May 20, 2010 2:30 PM  Additional Follow-up for Phone Call Additional follow up Details #3:: Details for Additional Follow-up Action Taken: Spoke with patient. He has tapered off the Prednisone already. He will call us for further problems. Additional Follow-up by: Jesse Fall RN,  May 23, 2010 8:34 AM

## 2010-09-02 NOTE — Procedures (Signed)
[facility]                              EXERCISE TREADMILL   NAME:Greathouse, KENO CARAWAY                      MRN:          [id number]  DATE:05/14/2006                            DOB:          [birth year]    PROCEDURE:  Exercise treadmill stress test.   INDICATION:  Mr. Iten is a 55 year old gentleman who was referred for  a cardiovascular evaluation in the setting of a strong family history of  coronary artery disease and borderline lipids.  He is asymptomatic.  I  elected to perform an exercise treadmill study due to his strong family  history.  His father died of a myocardial infarction at age 59.   INTERPRETATION:  Mr. Mudgett exercised for 10 minutes according to the  Bruce protocol.  He achieved a work level of 11.6 metabolic equivalents.  He achieved 100% of the maximal age predicted heart rate.  The test was  stopped due to achievement of age predicted maximal heart rate.   Mr. Kaczmarczyk had no chest pain, arrhythmia, or significant ST changes with  exercise.  He has good exercise tolerance.  His resting ECG is normal  and there was no significant ST segment deviation.  His resting blood  pressure was 127/77 and his peak blood pressure was 183/80.   CONCLUSION:  Negative exercise electrocardiogram.     Veverly Fells. Excell Seltzer, MD  Electronically Signed    MDC/MedQ  DD: 05/14/2006  DT: 05/14/2006  Job #: [id number]   cc:   Sharlot Gowda, M.D.

## 2010-09-02 NOTE — Op Note (Signed)
NAME:  Russell Sanford, Russell Sanford               ACCOUNT NO.:  [id number]   MEDICAL RECORD NO.:  [id number]          PATIENT TYPE:  AMB   LOCATION:  ENDO                         FACILITY:  [facility]   PHYSICIAN:  Georgiana Spinner, M.D.    DATE OF BIRTH:  Sep 23, 1955   DATE OF PROCEDURE:  04/27/2006  DATE OF DISCHARGE:                               OPERATIVE REPORT   PROCEDURE:  Upper endoscopy.   ENDOSCOPIST:  Georgiana Spinner, M.D.   INDICATIONS:  GERD with Barrett's esophagus.   ANESTHESIA:  Demerol 80 mg, Versed 7.5 mg, Phenergan 12.5 mg.   PROCEDURE:  With the patient mildly sedated in the left lateral  decubitus position, the Pentax videoscopic endoscope was inserted into  the mouth and passed under direct vision through the esophagus, which  appeared normal until we reached distal esophagus, and there were some  small islands of Barrett's esophagus and 1 short arm of Barrett's  esophagus seen and biopsies were taken.  We entered into the stomach  through a hiatal hernia.  Fundus, body, antrum, duodenal bulb and second  portion of duodenum were visualized.  From this point, the endoscope was  slowly withdrawn, taking circumferential views of the duodenal mucosa  until the endoscope had been pulled back into the stomach and placed in  retroflexion to view the stomach from below.  The endoscope was then  straightened and withdrawn, taking circumferential views of the  remaining gastric and esophageal mucosa.  The patient's vital signs and  pulse oximetry remained stable.  The patient tolerated the procedure  well without apparent complication.   FINDINGS:  Areas of what appeared to be Barrett's esophagus, biopsied,  await biopsy report.  The patient will call me for results and follow up  with me as an outpatient.   PLAN:  Proceed to colonoscopy as planned.           ______________________________  Georgiana Spinner, M.D.     GMO/MEDQ  D:  04/27/2006  T:  04/27/2006  Job:  [id number]   cc:   Sharlot Gowda, M.D.

## 2010-09-02 NOTE — Letter (Signed)
March 28, 2006    Sharlot Gowda, M.D.  [address]  [place], [place] [address]   RE:  [name], [name]  MRN:  [id number]  /  DOB:  30-Dec-1955   Dear Dr. Susann Givens,   It was my pleasure to see Russell Sanford as an outpatient at the [facility] this morning. As you know, Russell Sanford is a very nice  55 year old gentleman who presents for a cardiac evaluation in the  setting of his family history of coronary artery disease. Russell Sanford is  a physically active gentleman whose main form of exercise is heavy  weightlifting. He reports that he has participated in weightlifting  since age 76. He lifts weights everyday but does not do much  cardiovascular exercise. With weightlifting, he has no symptoms. He  states that he is able to do hard physical work without any chest pain  or dyspnea. He specifically denies lightheadedness, palpitations,  syncope, orthopnea, PND or edema. He does complain of generalized  fatigue.   Upon reviewing his history, he reports a strong family history of  coronary artery disease. His father died of a myocardial infarction at  age 69 and he is concerned about his cardiac risk and therefore presents  today for evaluation.   CURRENT MEDICATIONS:  1. Prilosec 20 mg daily.  2. Celebrex 200 mg daily.   ALLERGIES:  SULFA.   PAST MEDICAL HISTORY:  Pertinent for knee and shoulder arthroscopic  surgeries, osteoarthritis, and gastroesophageal reflux disease.   FAMILY HISTORY:  As above, the patient's father died of a myocardial  infarction at age 50. His brother recently died at age 26 of renal cell  carcinoma. There is no other coronary artery disease in his family.   SOCIAL HISTORY:  The patient is married with 3 children. He works as a  Medical illustrator. He exercises regularly as described above with weightlifting  activities. He does not do aerobic exercise at this point but plans on  starting soon. He does not smoke cigarettes. He does not use  recreational drugs. He does drink alcohol on the weekends. He reports no  alcohol use during weekdays but drinks up to one case of beer per day on  the weekends.   REVIEW OF SYSTEMS:  A complete 12-point review of systems was performed.  Pertinent positives included knee and joint pain and gastroesophageal  reflux. All other systems were reviewed and are negative except as  described above.   PHYSICAL EXAMINATION:  GENERAL:  The patient is alert and oriented. He  is in no acute distress. He is a muscular middle-aged male.  VITAL SIGNS:  His height is 6 feet 1 inches, weight is 274 pounds. Blood  pressure is 127/79, heart rate 63, respiratory rate 12.  HEENT:  Normal.  NECK:  Normal carotid upstrokes without bruit. Jugular venous pressure  is normal. There is no thyromegaly or thyroid nodules.  LUNGS:  Clear to auscultation bilaterally.  CARDIOVASCULAR:  The apex is not palpable.  HEART:  Regular rate and rhythm without murmurs or gallops. There is no  right ventricular heave or lift.  ABDOMEN:  Soft, nontender, no organomegaly. No abdominal bruits. No  rebound or guarding.  EXTREMITIES:  There is no clubbing, cyanosis or edema. Peripheral pulses  are 2+ and equal throughout. There were no femoral artery bruits.  SKIN:  Warm and dry without rash.  NEUROLOGIC:  Cranial nerves II-XII are intact. Strength is 5/5 and equal  in the arms and legs.  EKG demonstrates normal sinus rhythm and is within normal limits. The  ventricular rate is 63.   ASSESSMENT:  Russell Sanford is a 55 year old gentleman with cardiovascular  risk factors that include family history and borderline cholesterol. He  is asymptomatic with the exception of generalized fatigue. He is  concerned about his cardiac risk in the setting of his father's  myocardial infarction at age 36. I think it is reasonable to perform an  exercise treadmill stress test on Russell Sanford which I will schedule for  the near future. If he has a  normal exercise study then I would be  inclined to not perform any further testing. It should be able to give  [place] a good assessment of his functional capacity as well as the presence  of symptoms or ECG changes with this study. In the setting of his strong  family history, I would consider the addition of a low dose aspirin to  his medical regimen although this would have to be weighed against the  risk of  developing GI problems used in combination with Celebrex. I will  certainly leave that to your discretion.   Dr. Susann Givens, thanks again for allowing me to see Russell Sanford. I  appreciate the opportunity to participate in his care and I will be in  touch with you after his exercise ECG is performed.    Sincerely,      Veverly Fells. Excell Seltzer, MD  Electronically Signed    MDC/MedQ  DD: 03/28/2006  DT: 03/28/2006  Job #: [id number]

## 2010-09-02 NOTE — Procedures (Signed)
[place]  Patient:    Russell Sanford, Russell Sanford                        MRN: [id number] Proc. Date: 04/24/00 Adm. Date:  [id number] Attending:  Sabino Gasser CC:         Ronnald Nian, M.D.  Dr. Thomasena Edis   Procedure Report  PROCEDURE:  Colonoscopy.  INDICATIONS:  Heme-positive stools, diarrhea.  ANESTHESIA:  None further given.  See endoscopy.  DESCRIPTION OF PROCEDURE:  With patient mildly sedated in the left lateral decubitus position, a rectal examination was performed, which was unremarkable.  Subsequently the Olympus videoscopic colonoscope was inserted in the rectum and passed under direct vision to the cecum.  Cecum identified by ileocecal valve and appendiceal orifice, both of which were photographed and appeared normal.  We entered into the terminal ileum, and this appeared grossly normal, although there was some increased lymphoid tissue, which we photographed and biopsied.  Subsequently the colonoscope was then slowly withdrawn, taking circumferential views of the terminal ileum and subsequently colonic mucosa until the colonoscope was withdrawn all the way to the rectum. We took random colon biopsies along the way.  The rectum appeared normal in direct view and was unremarkable on retroflex view.  The endoscope was straightened and withdrawn.  The patients vital signs and pulse oximetry remained stable.  The patient tolerated the procedure well and without apparent complications.  FINDINGS:  Essentially negative colonoscopic examination.  PLAN:  Have patient follow up with me as an outpatient.  See endoscopy note for further details. DD:  04/24/00 TD:  04/24/00 Job: [id number]

## 2010-09-02 NOTE — Op Note (Signed)
   Russell Sanford, FIFE                        ACCOUNT NO.:  [id number]   MEDICAL RECORD NO.:  [id number]                   PATIENT TYPE:  AMB   LOCATION:  ENDO                                 FACILITY:  [facility]   PHYSICIAN:  Georgiana Spinner, M.D.                 DATE OF BIRTH:  1955-06-27   DATE OF PROCEDURE:  DATE OF DISCHARGE:                                 OPERATIVE REPORT   PROCEDURE:  Upper endoscopy.   INDICATIONS FOR PROCEDURE:  Follow-up of endoscopic Barrett's esophagus.   ANESTHESIA:  Demerol 80, Versed 8 mg.   DESCRIPTION OF PROCEDURE:  With the patient mildly sedated in the left  lateral decubitus position, the Olympus videoscopic endoscope was inserted  in the mouth and passed under direct vision through the esophagus which  appeared normal, photographs taken until we reached the distal esophagus. At  this point, there was not any clear evidence of Barrett's although there may  have been two small areas to be suspected and these were photographed and  biopsies were taken of these areas but this may have been a normal variant.  We entered into the stomach, the fundus, body, antrum, duodenal bulb and  second portion of the duodenum all appeared normal. From this point, the  endoscope was slowly withdrawn taking circumferential views of the entire  duodenal mucosa until the endoscope was then pulled back in the stomach,  placed in retroflexion to view the stomach from below. The endoscope was  then straightened and withdrawn taking circumferential views of the  remaining gastric and esophageal mucosa which otherwise appeared normal. The  patient's vital signs and pulse oximeter remained stable. The patient  tolerated the procedure well without apparent complications.   FINDINGS:  With a clear view of the distal esophagus, it is not clear that  he has Barrett's at this point endoscopically. If biopsies are negative  would relax his screening and I will have patient call  me for results of  biopsy and followup with me as an outpatient as needed.                                                 Georgiana Spinner, M.D.    GMO/MEDQ  D:  12/11/2001  T:  12/12/2001  Job:  [id number]   cc:   Ronnald Nian, M.D.

## 2010-09-02 NOTE — Op Note (Signed)
NAME:  Russell Sanford, LOWRIMORE NO.:  [id number]   MEDICAL RECORD NO.:  [id number]          PATIENT TYPE:  AMB   LOCATION:  ENDO                         FACILITY:  [facility]   PHYSICIAN:  Georgiana Spinner, M.D.    DATE OF BIRTH:  04/17/1956   DATE OF PROCEDURE:  04/27/2006  DATE OF DISCHARGE:                               OPERATIVE REPORT   SURGEON:  Georgiana Spinner, M.D.   PROCEDURE:  Colonoscopy.   INDICATIONS:  Colon cancer screening.   ANESTHESIA:  Demerol 20 mg, Versed 1 mg.   DESCRIPTION OF PROCEDURE:  With the patient mildly sedated in the left  lateral decubitus position, a rectal exam was performed, which was  unremarkable to my examination.  Subsequently, the Pentax videoscopic  colonoscope was inserted into the rectum and passed under direct vision  to the cecum, identified by the ileocecal valve and appendiceal orifice,  both of which were photographed.  From this point, the colonoscope was  slowly withdrawn, taking circumferential views of colonic mucosa,  stopping only in the rectum, which appeared, and the rectum showed  hemorrhoids on retroflex view.  The endoscope was then straightened and  withdrawn.  The patient's vital signs and pulse oximetry remained  stable.  The patient tolerated the procedure well and without apparent  complications.   FINDINGS:  Internal hemorrhoids, otherwise, a normal colonoscopic  examination.   PLAN:  Consider repeat examination in 5 to 10 years.           ______________________________  Georgiana Spinner, M.D.     GMO/MEDQ  D:  04/27/2006  T:  04/27/2006  Job:  [id number]   cc:   Sharlot Gowda, M.D.

## 2010-09-02 NOTE — Procedures (Signed)
[place]  Patient:    Russell Sanford, Russell Sanford                        MRN: [id number] Proc. Date: 04/24/00 Adm. Date:  [id number] Attending:  Sabino Gasser CC:         Ronnald Nian, M.D.             Benny Lennert, M.D.                           Procedure Report  PROCEDURE PERFORMED:  Upper endoscopy.  INDICATIONS:  Reflux symptomatology.  See my clinical note dated March 23, 2000.  DESCRIPTION OF PROCEDURE:   With the patient mildly sedated in the left lateral decubitus position, the Olympus videoscopic endoscope was inserted into the mouth, passed under direct vision through the esophagus.  Distal esophagus was approached and showed a hiatal hernia and Barretts esophagus with esophagitis seen as evidenced by flames of erythematous tissue extending cephalad into the esophagus.  This was photographed and multiple biopsies were taken.  We entered into the stomach through the hiatal hernia sac.  Fundus body, antrum, duodenal bulb, second portion of the duodenum were all well visualized and appeared normal.  Photographs were taken.  From this point, the endoscope was slowly withdrawn, taking circumferential views of the entire duodenal mucosa until the endoscope was pulled back into the stomach, placed on retroflexion to view the stomach from below and a hiatal hernia was seen from below and photographed.  This is evidenced by incomplete wrap of the GE junction around the endoscope.  The endoscope was then straightened and withdrawn, taking circumferential views of the entire gastric and subsequently esophageal mucosa which otherwise appeared normal.  The patients vital signs and pulse oximetry remained stable.  The patient tolerated the procedure well with no apparent complications.  FINDINGS:  Hiatal hernia and Barretts esophagus above this, biopsied.  Await biopsy report.  PLAN:  The patient states he continues to have symptoms on Nexium of  belching and reflux of material; however, it is less acidic and less noxious on medications and will plan on continuing that and discuss this further with him when we follow up as an outpatient.  Proceed to colonoscopy as planned. DD:  04/24/00 TD:  04/24/00 Job: [id number]

## 2011-04-26 ENCOUNTER — Telehealth: Payer: Self-pay | Admitting: Internal Medicine

## 2011-04-26 MED ORDER — ROSUVASTATIN CALCIUM 10 MG PO TABS: 10.0000 mg | ORAL_TABLET | Freq: Every day | ORAL | Status: DC

## 2011-04-26 NOTE — Telephone Encounter (Signed)
Sent med in but pt needs ov

## 2011-04-27 ENCOUNTER — Encounter: Payer: Self-pay | Admitting: Internal Medicine

## 2011-05-02 ENCOUNTER — Encounter: Payer: Self-pay | Admitting: Family Medicine

## 2011-05-02 ENCOUNTER — Ambulatory Visit (INDEPENDENT_AMBULATORY_CARE_PROVIDER_SITE_OTHER): Payer: BC Managed Care – PPO | Admitting: Family Medicine

## 2011-05-02 VITALS — BP 126/82 | HR 72 | Temp 98.2°F | Wt 268.0 lb

## 2011-05-02 DIAGNOSIS — Z8249 Family history of ischemic heart disease and other diseases of the circulatory system: Secondary | ICD-10-CM

## 2011-05-02 DIAGNOSIS — J111 Influenza due to unidentified influenza virus with other respiratory manifestations: Secondary | ICD-10-CM

## 2011-05-02 DIAGNOSIS — E785 Hyperlipidemia, unspecified: Secondary | ICD-10-CM

## 2011-05-02 DIAGNOSIS — M129 Arthropathy, unspecified: Secondary | ICD-10-CM

## 2011-05-02 DIAGNOSIS — Z79899 Other long term (current) drug therapy: Secondary | ICD-10-CM

## 2011-05-02 DIAGNOSIS — M1990 Unspecified osteoarthritis, unspecified site: Secondary | ICD-10-CM | POA: Insufficient documentation

## 2011-05-02 DIAGNOSIS — Z125 Encounter for screening for malignant neoplasm of prostate: Secondary | ICD-10-CM

## 2011-05-02 DIAGNOSIS — Z8719 Personal history of other diseases of the digestive system: Secondary | ICD-10-CM

## 2011-05-02 LAB — COMPREHENSIVE METABOLIC PANEL
ALT: 30 U/L (ref 0–53)
AST: 29 U/L (ref 0–37)
Albumin: 4.8 g/dL (ref 3.5–5.2)
Alkaline Phosphatase: 66 U/L (ref 39–117)
Calcium: 9.9 mg/dL (ref 8.4–10.5)
Chloride: 101 mEq/L (ref 96–112)
Creat: 0.77 mg/dL (ref 0.50–1.35)
Potassium: 4 mEq/L (ref 3.5–5.3)

## 2011-05-02 LAB — CBC WITH DIFFERENTIAL/PLATELET
Basophils Absolute: 0 10*3/uL (ref 0.0–0.1)
Lymphocytes Relative: 22 % (ref 12–46)
Neutro Abs: 3.4 10*3/uL (ref 1.7–7.7)
Platelets: 224 10*3/uL (ref 150–400)
RDW: 12.3 % (ref 11.5–15.5)
WBC: 5.3 10*3/uL (ref 4.0–10.5)

## 2011-05-02 LAB — LIPID PANEL
LDL Cholesterol: 99 mg/dL (ref 0–99)
Total CHOL/HDL Ratio: 4 Ratio

## 2011-05-02 LAB — POC HEMOCCULT BLD/STL (OFFICE/1-CARD/DIAGNOSTIC): Fecal Occult Blood, POC: NEGATIVE

## 2011-05-02 LAB — PSA: PSA: 0.27 ng/mL (ref <=4.00)

## 2011-05-02 MED ORDER — CELECOXIB 200 MG PO CAPS: 200.0000 mg | ORAL_CAPSULE | Freq: Two times a day (BID) | ORAL | Status: DC

## 2011-05-02 MED ORDER — ROSUVASTATIN CALCIUM 10 MG PO TABS: 10.0000 mg | ORAL_TABLET | Freq: Every day | ORAL | Status: DC

## 2011-05-02 MED ORDER — OMEPRAZOLE MAGNESIUM 20 MG PO TBEC: 20.0000 mg | DELAYED_RELEASE_TABLET | Freq: Every day | ORAL | Status: DC

## 2011-05-02 NOTE — Progress Notes (Signed)
  Subjective:    Patient ID: Russell Sanford, male    DOB: [birth year], 56 y.o.   MRN: [id number]  HPI He has a one-day history of headache, fever, chills, fatigue, myalgias, congestion and coughing. He is also here mainly for a recheck. He continues on Celebrex for treatment of his arthritis that bothers him in several joints especially his knees. He does plan on having bilateral knee joint replacements hopefully the near future. He does have an underlying history of Barrett's esophagus and continues to do well on Prilosec. He continues on Crestor for treatment of his underlying hyperlipidemia. There is also a family history of heart disease. He has had no more difficulty with his colitis. He does drink a fair amount of alcohol.   Review of Systems     Objective:   Physical Exam alert and in no distress. Tympanic membranes and canals are normal. Throat is clear. Tonsils are normal. Neck is supple without adenopathy or thyromegaly. Cardiac exam shows a regular sinus rhythm without murmurs or gallops. Lungs are clear to auscultation.        Assessment & Plan:   1. Flu syndrome    2. Arthritis  POCT Urinalysis Dipstick  3. Family history of heart disease in male family member before age 61  CBC with Differential, Comprehensive metabolic panel, Lipid panel, PR ELECTROCARDIOGRAM, COMPLETE, Ambulatory referral to Cardiology  4. Hyperlipidemia LDL goal <100    5. BARRETT'S ESOPHAGUS, HX OF    6. Special screening for malignant neoplasm of prostate  PSA  7. Encounter for long-term (current) use of other medications  POCT Urinalysis Dipstick, POCT Hemoccult (POC) Blood/Stool Test   strongly encouraged him to cut back on his alcohol consumption. Continue on present medications. Supportive care for the flu syndrome. Thus Tamiflu however he declined prescription

## 2011-05-02 NOTE — Telephone Encounter (Signed)
Coming in for an med check appt on 1/15

## 2011-05-02 NOTE — Patient Instructions (Signed)
Use Tylenol and/or Advil or Aleve for the fever, aches, pains. If coughing continues for over a week, call me

## 2011-05-17 ENCOUNTER — Encounter: Payer: BC Managed Care – PPO | Admitting: Cardiovascular Disease

## 2011-08-26 IMAGING — RF DG UGI W/ SMALL BOWEL
19 of 24 series · 19 of 24 positions shown · IV contrast (agent unspecified)
Comparison: None.

CLINICAL DATA: Diarrhea, abdominal pain, some weight loss

UPPER GI W/ SMALL BOWEL
TECHNIQUE: Upper GI series performed with high density barium and
effervescent agent. Thin barium also used.  Subsequently, serial
images of the small bowel were obtained including spot views of the
terminal ileum.
Fluoroscopy Time: 3.3 minutes
Contrast: Double contrast upper GI with small bowel follow-through

[Series 1: run · 1 of 1 slices shown (1 of 19)]
[im 1/1]
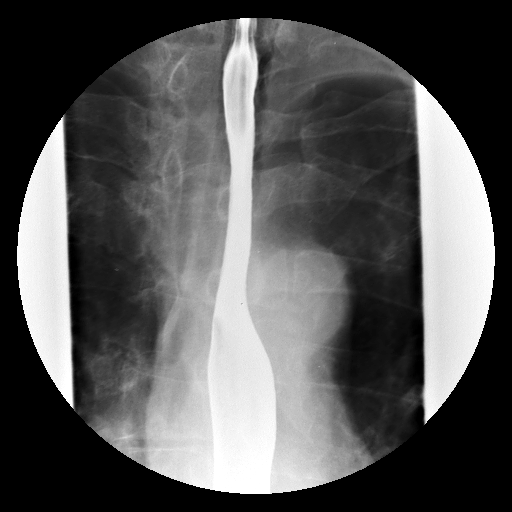

[Series 2: run · 1 of 1 slices shown (2 of 19)]
[im 1/1]
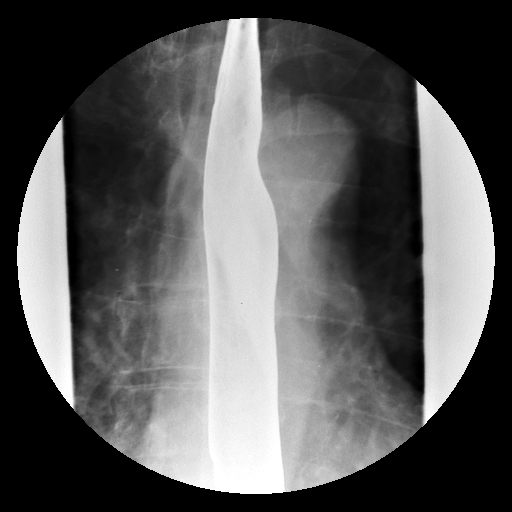

[Series 4: run · 1 of 1 slices shown (3 of 19)]
[im 1/1]
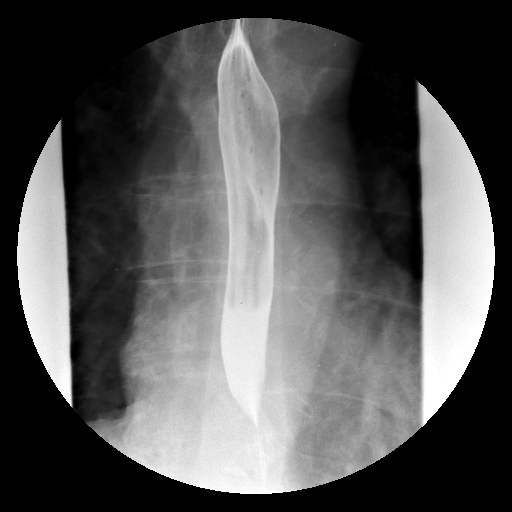

[Series 5: run · 1 of 1 slices shown (4 of 19)]
[im 1/1]
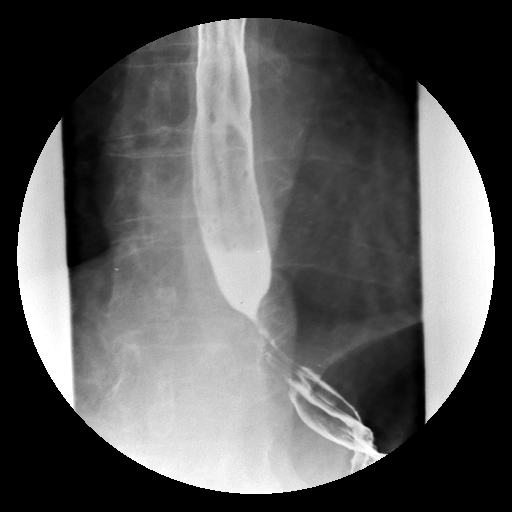

[Series 6: run · 1 of 1 slices shown (5 of 19)]
[im 1/1]
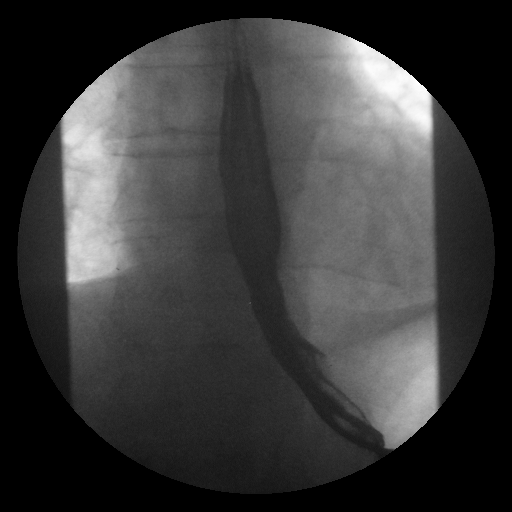

[Series 7: run · 1 of 1 slices shown (6 of 19)]
[im 1/1]
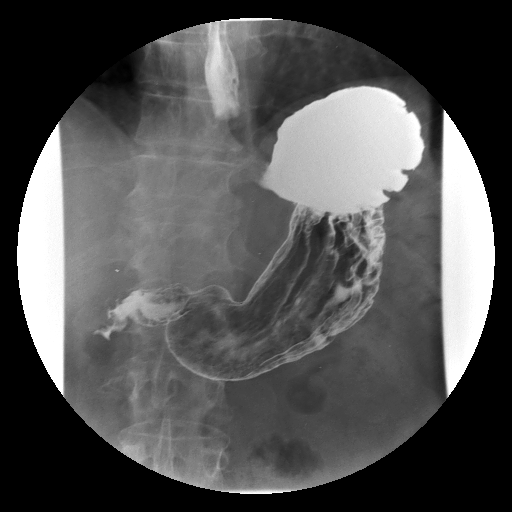

[Series 9: run · 1 of 1 slices shown (7 of 19)]
[im 1/1]
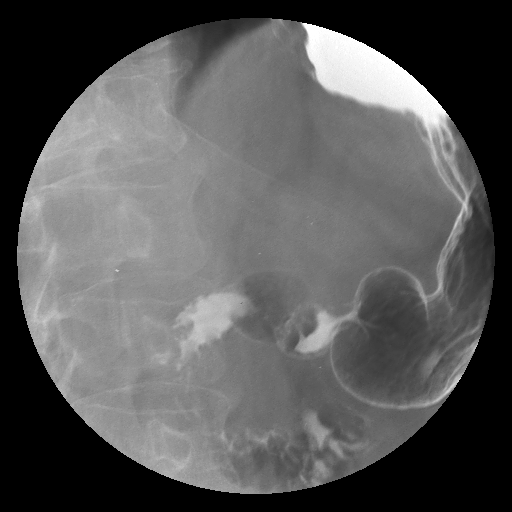

[Series 10: run · 1 of 1 slices shown (8 of 19)]
[im 1/1]
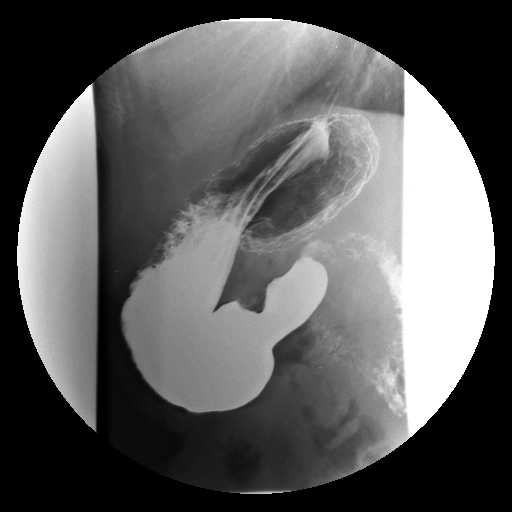

[Series 11: run · 1 of 1 slices shown (9 of 19)]
[im 1/1]
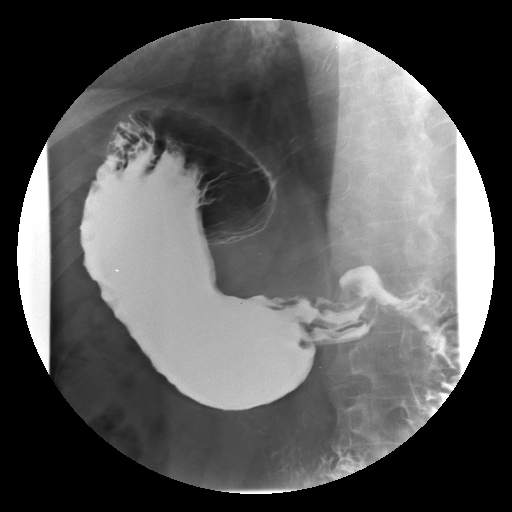

[Series 13: run · 1 of 1 slices shown (10 of 19)]
[im 1/1]
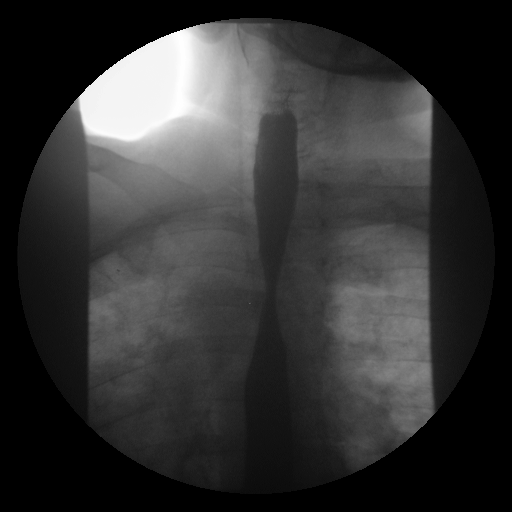

[Series 14: run · 1 of 1 slices shown (11 of 19)]
[im 1/1]
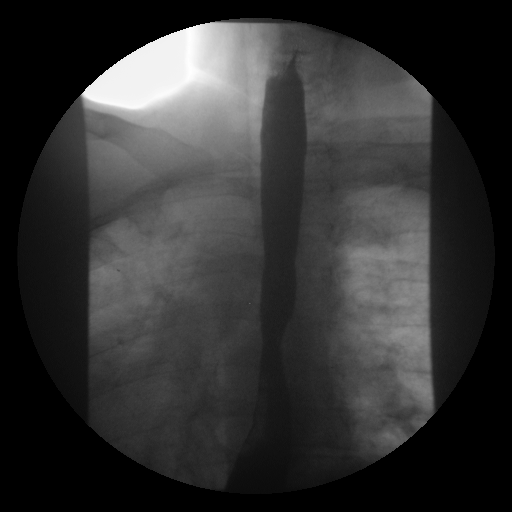

[Series 15: run · 1 of 1 slices shown (12 of 19)]
[im 1/1]
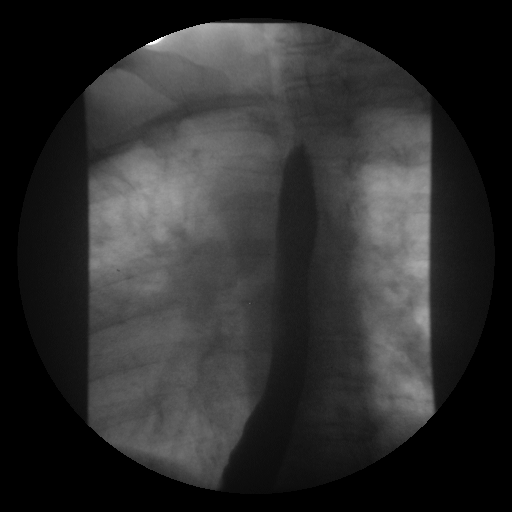

[Series 16: run · 1 of 1 slices shown (13 of 19)]
[im 1/1]
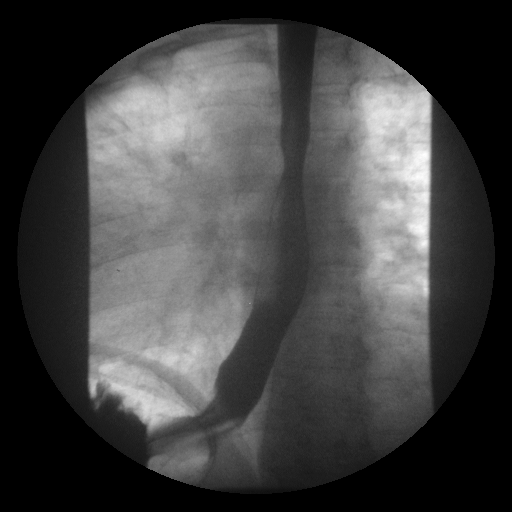

[Series 18: run · 1 of 1 slices shown (14 of 19)]
[im 1/1]
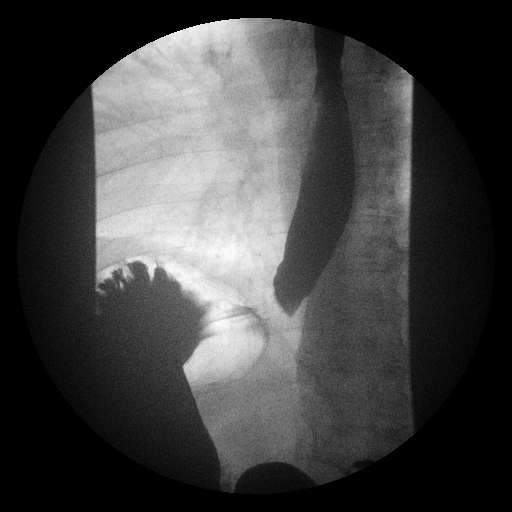

[Series 19: run · 1 of 1 slices shown (15 of 19)]
[im 1/1]
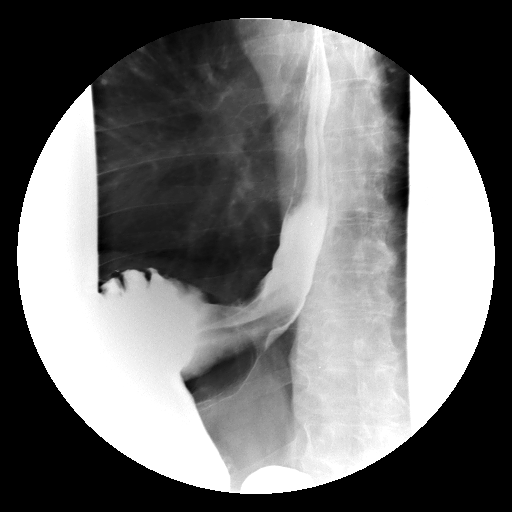

[Series 20: run · 1 of 1 slices shown (16 of 19)]
[im 1/1]
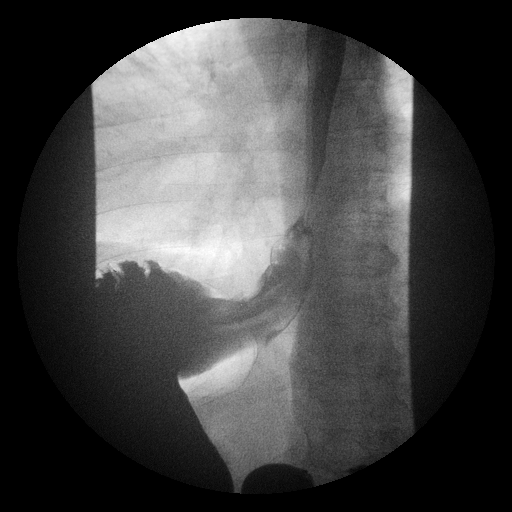

[Series 21: run · 1 of 1 slices shown (17 of 19)]
[im 1/1]
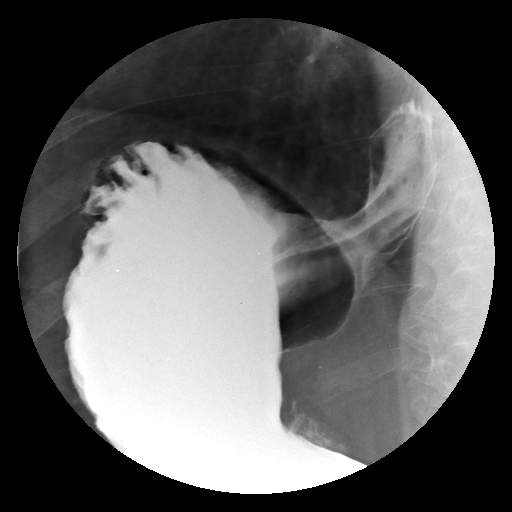

[Series 23: run · 1 of 1 slices shown (18 of 19)]
[im 1/1]
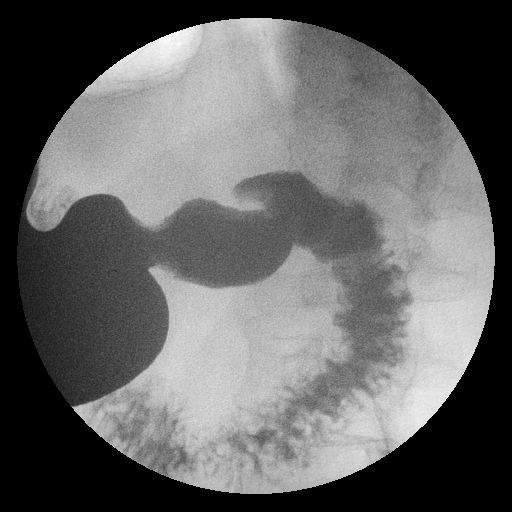

[Series 24: run · 1 of 1 slices shown (19 of 19)]
[im 1/1]
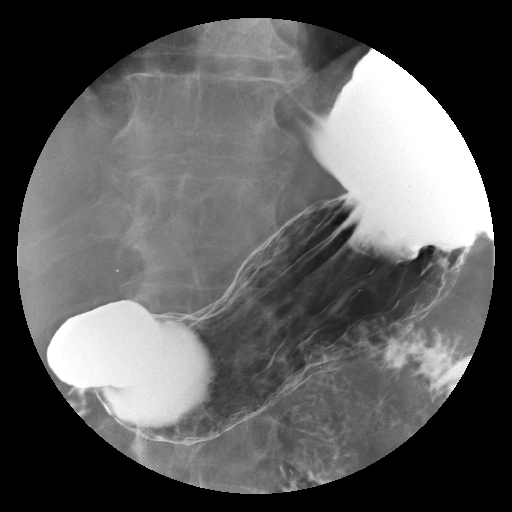

[19 of 24 positions shown; findings below may reference images not displayed]

FINDINGS: A preliminary film of the abdomen shows a nonspecific
bowel gas pattern.

A double contrast study was performed.  The mucosa of the esophagus
appears normal.  A single contrast study shows the swallowing
mechanism to be normal.  There is  a small hiatal hernia present.
Moderate gastroesophageal reflux is demonstrated.  A barium pill
was given at the end of the study which did pass into the stomach
without delay.

The stomach is normal in contour and peristalsis.  The duodenal
bulb fills and the duodenal loop is in normal position.

The patient was given additional barium orally and images of the
small bowel were obtained.  Barium reached the colon by the 0-
minute film, indicating very rapid transit time.  However, no
abnormality of small bowel loops is seen.  There is no edema, mass,
or dilatation of small bowel.  The terminal ileum is well
visualized and appears normal.
IMPRESSION: 1.  Small hiatal hernia.
2.  Moderate gastroesophageal reflux.  Barium pill passes into the
stomach without delay.
3.  Very rapid transit time through the small bowel.  No
abnormality of the small bowel is detected.  The terminal ileum
appears normal.

## 2012-03-13 ENCOUNTER — Encounter: Payer: Self-pay | Admitting: Family Medicine

## 2012-03-13 ENCOUNTER — Other Ambulatory Visit: Payer: Self-pay | Admitting: Family Medicine

## 2012-03-13 ENCOUNTER — Ambulatory Visit (INDEPENDENT_AMBULATORY_CARE_PROVIDER_SITE_OTHER): Payer: BC Managed Care – PPO | Admitting: Family Medicine

## 2012-03-13 ENCOUNTER — Telehealth: Payer: Self-pay | Admitting: Family Medicine

## 2012-03-13 VITALS — BP 118/78 | HR 78 | Ht 71.5 in | Wt 273.0 lb

## 2012-03-13 DIAGNOSIS — Z8249 Family history of ischemic heart disease and other diseases of the circulatory system: Secondary | ICD-10-CM

## 2012-03-13 DIAGNOSIS — E785 Hyperlipidemia, unspecified: Secondary | ICD-10-CM

## 2012-03-13 DIAGNOSIS — M1990 Unspecified osteoarthritis, unspecified site: Secondary | ICD-10-CM

## 2012-03-13 DIAGNOSIS — Z01818 Encounter for other preprocedural examination: Secondary | ICD-10-CM

## 2012-03-13 DIAGNOSIS — Z23 Encounter for immunization: Secondary | ICD-10-CM

## 2012-03-13 DIAGNOSIS — Z79899 Other long term (current) drug therapy: Secondary | ICD-10-CM

## 2012-03-13 DIAGNOSIS — M129 Arthropathy, unspecified: Secondary | ICD-10-CM

## 2012-03-13 LAB — CBC WITH DIFFERENTIAL/PLATELET
Basophils Absolute: 0 10*3/uL (ref 0.0–0.1)
Basophils Relative: 0 % (ref 0–1)
Eosinophils Absolute: 0.1 10*3/uL (ref 0.0–0.7)
Eosinophils Relative: 2 % (ref 0–5)
MCH: 32.3 pg (ref 26.0–34.0)
MCV: 91.1 fL (ref 78.0–100.0)
Neutrophils Relative %: 61 % (ref 43–77)
Platelets: 240 10*3/uL (ref 150–400)
RDW: 12.8 % (ref 11.5–15.5)

## 2012-03-13 LAB — LIPID PANEL
Cholesterol: 156 mg/dL (ref 0–200)
HDL: 48 mg/dL (ref >39)
Total CHOL/HDL Ratio: 3.3 Ratio
VLDL: 60 mg/dL — ABNORMAL HIGH (ref 0–40)

## 2012-03-13 LAB — COMPREHENSIVE METABOLIC PANEL
ALT: 29 U/L (ref 0–53)
AST: 29 U/L (ref 0–37)
Alkaline Phosphatase: 75 U/L (ref 39–117)
Creat: 0.82 mg/dL (ref 0.50–1.35)
Sodium: 136 mEq/L (ref 135–145)
Total Bilirubin: 0.7 mg/dL (ref 0.3–1.2)

## 2012-03-13 MED ORDER — TRAMADOL HCL 50 MG PO TABS: 50.0000 mg | ORAL_TABLET | Freq: Three times a day (TID) | ORAL | Status: DC | PRN

## 2012-03-13 NOTE — Progress Notes (Signed)
  Subjective:    Patient ID: Russell Sanford, male    DOB: 1956-03-22, 56 y.o.   MRN: [id number]  HPI He is here for surgical clearance. Both knees are arthritic and causing him a great deal of difficulty. This is interfering with the quality of his life. He states that Celebrex has not able to control his pain. He is taking over-the-counter supplements to help with sleep. He is set up to have knee surgery soon as he is cleared by me. He has had no difficulty with chest pain, shortness of breath, DOE. Does have a positive family history for heart disease. He did not go for cardiology evaluation last January when he was scheduled. He continues on medications listed in the chart. Social history was reviewed. His work is going well.   Review of Systems     Objective:   Physical Exam alert and in no distress. Tympanic membranes and canals are normal. Throat is clear. Tonsils are normal. Neck is supple without adenopathy or thyromegaly. Cardiac exam shows a regular sinus rhythm without murmurs or gallops. Lungs are clear to auscultation. Abdominal exam shows no masses or tenderness. EKG from January was reviewed.       Assessment & Plan:   1. Arthritis  traMADol (ULTRAM) 50 MG tablet  2. Preoperative evaluation of a medical condition to rule out surgical contraindications (TAR required)    3. HYPERLIPIDEMIA  Lipid panel, Ambulatory referral to Cardiology  4. Family history of heart disease in male family member before age 28  Lipid panel, CBC with Differential, Comprehensive metabolic panel, Ambulatory referral to Cardiology  5. Encounter for long-term (current) use of other medications  Lipid panel, CBC with Differential, Comprehensive metabolic panel, Tdap vaccine greater than or equal to 7yo IM, Flu vaccine greater than or equal to 3yo preservative free IM   I will have him try tramadol to help with his pain. Flu shot given with risks and benefits. He also got his tetanus shot. He will need to  be cleared by cardiology before surgery.

## 2012-03-15 LAB — HEMOGLOBIN A1C: Hgb A1c MFr Bld: 5.6 % (ref <5.7)

## 2012-03-18 NOTE — Progress Notes (Signed)
Quick Note:  Mailed pt copy of labs and diet info ______ 

## 2012-03-20 NOTE — Telephone Encounter (Signed)
03/20/2012 °

## 2012-03-28 ENCOUNTER — Ambulatory Visit (INDEPENDENT_AMBULATORY_CARE_PROVIDER_SITE_OTHER): Payer: BC Managed Care – PPO | Admitting: Cardiovascular Disease

## 2012-03-28 ENCOUNTER — Encounter: Payer: Self-pay | Admitting: Cardiovascular Disease

## 2012-03-28 VITALS — BP 137/84 | HR 76 | Ht 72.0 in | Wt 273.0 lb

## 2012-03-28 DIAGNOSIS — M129 Arthropathy, unspecified: Secondary | ICD-10-CM

## 2012-03-28 DIAGNOSIS — Z8249 Family history of ischemic heart disease and other diseases of the circulatory system: Secondary | ICD-10-CM

## 2012-03-28 DIAGNOSIS — Z01818 Encounter for other preprocedural examination: Secondary | ICD-10-CM

## 2012-03-28 DIAGNOSIS — Z01810 Encounter for preprocedural cardiovascular examination: Secondary | ICD-10-CM | POA: Insufficient documentation

## 2012-03-28 DIAGNOSIS — E785 Hyperlipidemia, unspecified: Secondary | ICD-10-CM

## 2012-03-28 DIAGNOSIS — Z01811 Encounter for preprocedural respiratory examination: Secondary | ICD-10-CM

## 2012-03-28 DIAGNOSIS — M1990 Unspecified osteoarthritis, unspecified site: Secondary | ICD-10-CM

## 2012-03-28 NOTE — Assessment & Plan Note (Signed)
Asymptomatic male with normal ECG Clear to have surgery with no further w/u

## 2012-03-28 NOTE — Assessment & Plan Note (Signed)
Discussed calcium score with patient  Best way to assess risk of cardiac event in the next 5 years.  He will consider  Continue 81 mg ASA and statin

## 2012-03-28 NOTE — Assessment & Plan Note (Signed)
Needs both knees and left shoulder repaired  F/U Dr Thomasena Edis Celebrex and tramadol for pain

## 2012-03-28 NOTE — Patient Instructions (Signed)
Your physician recommends that you schedule a follow-up appointment in: AS NEEDED  Your physician recommends that you continue on your current medications as directed. Please refer to the Current Medication list given to you today.  

## 2012-03-28 NOTE — Progress Notes (Signed)
Patient ID: Russell Sanford, male   DOB: 07-17-1955, 56 y.o.   MRN: [id number] He is here for surgical clearance.  Referred by Dr Elmer Picker Both knees are arthritic and causing him a great deal of difficulty. This is interfering with the quality of his life. He states that Celebrex has not able to control his pain. He is taking over-the-counter supplements to help with sleep. He is set up to have knee surgery soon as he is cleared by me. He has had no difficulty with chest pain, shortness of breath, DOE. Does have a positive family history for heart disease. He did not go for cardiology evaluation last January when he was scheduled. Takes statin for elevated cholesterol.  Never has had chest pain dyspnea or syncope.  ECG is normal.  Has not had general anesthesia for 5-6 years since arthroscopic right shoulder surgery.  Previous football Designer, jewellery Denies steroid use   ROS: Denies fever, malais, weight loss, blurry vision, decreased visual acuity, cough, sputum, SOB, hemoptysis, pleuritic pain, palpitaitons, heartburn, abdominal pain, melena, lower extremity edema, claudication, or rash.  All other systems reviewed and negative   General: Affect appropriate Healthy:  appears stated age HEENT: normal Neck supple with no adenopathy JVP normal no bruits no thyromegaly Lungs clear with no wheezing and good diaphragmatic motion Heart:  S1/S2 no murmur,rub, gallop or click PMI normal Abdomen: benighn, BS positve, no tenderness, no AAA no bruit.  No HSM or HJR Distal pulses intact with no bruits No edema Neuro non-focal Skin warm and dry No muscular weakness  Medications Current Outpatient Prescriptions  Medication Sig Dispense Refill  . celecoxib (CELEBREX) 200 MG capsule Take 1 capsule (200 mg total) by mouth 2 (two) times daily.  60 capsule  11  . omeprazole (PRILOSEC OTC) 20 MG tablet Take 1 tablet (20 mg total) by mouth daily.  30 tablet  11  . rosuvastatin (CRESTOR) 10 MG tablet  Take 1 tablet (10 mg total) by mouth daily.  30 tablet  11  . traMADol (ULTRAM) 50 MG tablet Take 1 tablet (50 mg total) by mouth every 8 (eight) hours as needed for pain.  50 tablet  2    Allergies Sulfonamide derivatives  Family History: No family history on file.  Social History: History   Social History  . Marital Status: Married    Spouse Name: N/A    Number of Children: N/A  . Years of Education: N/A   Occupational History  . Not on file.   Social History Main Topics  . Smoking status: Never Smoker   . Smokeless tobacco: Not on file  . Alcohol Use: 14.4 oz/week    24 Cans of beer per week  . Drug Use: No  . Sexually Active: Yes   Other Topics Concern  . Not on file   Social History Narrative  . No narrative on file    Electrocardiogram:  SR rate 76 normal ECG  Assessment and Plan

## 2012-03-28 NOTE — Assessment & Plan Note (Signed)
Cholesterol is at goal.  Continue current dose of statin and diet Rx.  No myalgias or side effects.  F/U  LFT's in 6 months. Lab Results  Component Value Date   LDLCALC 48 03/13/2012

## 2012-04-26 ENCOUNTER — Other Ambulatory Visit: Payer: Self-pay | Admitting: Pain Medicine

## 2012-05-02 ENCOUNTER — Other Ambulatory Visit: Payer: Self-pay | Admitting: Family Medicine

## 2012-05-07 ENCOUNTER — Encounter (HOSPITAL_COMMUNITY): Payer: Self-pay | Admitting: Pharmacy Technician

## 2012-05-08 ENCOUNTER — Other Ambulatory Visit: Payer: Self-pay | Admitting: Family Medicine

## 2012-05-08 NOTE — Telephone Encounter (Signed)
PATIENT NEEDS AN OFFICE VISIT BEFORE HIS MEDICATION RUNS IN ORDER TO RECEIVE ANY MORE REFILLS.

## 2012-05-09 ENCOUNTER — Telehealth: Payer: Self-pay | Admitting: Family Medicine

## 2012-05-09 ENCOUNTER — Encounter (HOSPITAL_COMMUNITY): Payer: Self-pay

## 2012-05-09 ENCOUNTER — Encounter (HOSPITAL_COMMUNITY)
Admission: RE | Admit: 2012-05-09 | Discharge: 2012-05-09 | Disposition: A | Discharge status: Home / Self Care | Payer: BC Managed Care – PPO | Source: Ambulatory Visit | Attending: Specialist | Admitting: Specialist

## 2012-05-09 HISTORY — DX: Pure hypercholesterolemia, unspecified: E78.00

## 2012-05-09 HISTORY — DX: Gastro-esophageal reflux disease without esophagitis: K21.9

## 2012-05-09 LAB — COMPREHENSIVE METABOLIC PANEL
ALT: 47 U/L (ref 0–53)
AST: 43 U/L — ABNORMAL HIGH (ref 0–37)
Alkaline Phosphatase: 92 U/L (ref 39–117)
CO2: 27 mEq/L (ref 19–32)
Chloride: 99 mEq/L (ref 96–112)
GFR calc Af Amer: >90 mL/min (ref >90)
GFR calc non Af Amer: >90 mL/min (ref >90)
Glucose, Bld: 89 mg/dL (ref 70–99)
Potassium: 4.1 mEq/L (ref 3.5–5.1)
Sodium: 136 mEq/L (ref 135–145)

## 2012-05-09 LAB — CBC WITH DIFFERENTIAL/PLATELET
Lymphocytes Relative: 24 % (ref 12–46)
Lymphs Abs: 1.5 10*3/uL (ref 0.7–4.0)
MCV: 91 fL (ref 78.0–100.0)
Neutro Abs: 3.8 10*3/uL (ref 1.7–7.7)
Neutrophils Relative %: 62 % (ref 43–77)
Platelets: 223 10*3/uL (ref 150–400)
RBC: 4.9 MIL/uL (ref 4.22–5.81)
WBC: 6.1 10*3/uL (ref 4.0–10.5)

## 2012-05-09 LAB — APTT: aPTT: 31 seconds (ref 24–37)

## 2012-05-09 LAB — URINALYSIS, ROUTINE W REFLEX MICROSCOPIC
Glucose, UA: NEGATIVE mg/dL
Hgb urine dipstick: NEGATIVE
Ketones, ur: NEGATIVE mg/dL
Protein, ur: NEGATIVE mg/dL
pH: 6 (ref 5.0–8.0)

## 2012-05-09 LAB — SURGICAL PCR SCREEN: MRSA, PCR: NEGATIVE

## 2012-05-09 NOTE — Progress Notes (Signed)
EKG 03/28/12 on EPIC, medical clearance note Dr. Susann Givens 03/13/12 on chart, cardiac clearance note Dr. Eden Emms on chart.

## 2012-05-09 NOTE — Patient Instructions (Addendum)
20 Russell Sanford  05/09/2012   Your procedure is scheduled on: 05/17/12  Report to [facility] at 10:30 AM.  Call this number if you have problems the morning of surgery 336-: [phone]   Remember:   Do not eat food or drink liquids After Midnight.     Take these medicines the morning of surgery with A SIP OF WATER: prilosec   Do not wear jewelry, make-up or nail polish.  Do not wear lotions, powders, or perfumes. You may wear deodorant.  Do not shave 48 hours prior to surgery. Men may shave face and neck.  Do not bring valuables to the hospital.  Contacts, dentures or bridgework may not be worn into surgery.  Leave suitcase in the car. After surgery it may be brought to your room.  For patients admitted to the hospital, checkout time is 11:00 AM the day of discharge.    Please read over the following fact sheets that you were given: MRSA Information, blood fact sheet, incentive spirometer fact sheet  Birdie Sons, RN  pre op nurse call if needed [phone]    FAILURE TO FOLLOW THESE INSTRUCTIONS MAY RESULT IN CANCELLATION OF YOUR SURGERY   Patient Signature: ___________________________________________

## 2012-05-09 NOTE — Telephone Encounter (Signed)
I called pt home reached wife, she gave me cell.  Called cell reached voice mail.  Pt needs ov per jcl to discuss possibility of Sleep Apnea.

## 2012-05-09 NOTE — Progress Notes (Signed)
05/09/12 0846  OBSTRUCTIVE SLEEP APNEA  Have you ever been diagnosed with sleep apnea through a sleep study? No  Do you snore loudly (loud enough to be heard through closed doors)?  0  Do you often feel tired, fatigued, or sleepy during the daytime? 1  Has anyone observed you stop breathing during your sleep? 0  Do you have, or are you being treated for high blood pressure? 0  BMI more than 35 kg/m2? 1  Age over 59 years old? 1  Neck circumference greater than 40 cm/18 inches? 0  Gender: 1  Obstructive Sleep Apnea Score 4   Score 4 or greater  Results sent to PCP

## 2012-05-13 ENCOUNTER — Other Ambulatory Visit: Payer: Self-pay | Admitting: Pain Medicine

## 2012-05-13 NOTE — H&P (Signed)
TOTAL KNEE ADMISSION H&P  Patient is being admitted for left total knee arthroplasty.  Subjective:  Chief Complaint:left knee pain.  HPI: Russell Sanford, 57 y.o. male, has a history of pain and functional disability in the left knee due to arthritis and has failed non-surgical conservative treatments for greater than 12 weeks to includeNSAID's and/or analgesics, corticosteriod injections, viscosupplementation injections, supervised PT with diminished ADL's post treatment and activity modification.  Onset of symptoms was gradual, starting 3 years ago with gradually worsening course since that time. The patient noted knee scopes on the left knee(s).  Patient currently rates pain in the left knee(s) at 7 out of 10 with activity. Patient has worsening of pain with activity and weight bearing.  Patient has evidence of subchondral sclerosis, periarticular osteophytes and joint space narrowing by imaging studies. This patient has had conservative treatment. There is no active infection.  Patient Active Problem List   Diagnosis Date Noted  . Preop cardiovascular exam 03/28/2012  . Family history of heart disease in male family member before age 69 05/02/2011  . Arthritis 05/02/2011  . Hyperlipidemia LDL goal <100 05/02/2011  . HYPERLIPIDEMIA 04/21/2010  . ARTHRITIS 04/21/2010  . NONSPEC ELEVATION OF LEVELS OF TRANSAMINASE/LDH 04/21/2010  . INTERNAL HEMORRHOIDS 04/20/2010  . GERD 04/20/2010  . DIARRHEA 04/20/2010  . ABDOMINAL PAIN, UNSPECIFIED SITE 04/20/2010  . BARRETT'S ESOPHAGUS, HX OF 04/20/2010   Past Medical History  Diagnosis Date  . HH (hiatus hernia)   . Barrett's esophagus   . Arthritis   . GERD (gastroesophageal reflux disease)   . Hypercholesteremia     Past Surgical History  Procedure Date  . Knee arthroscopy     bilateral  . Shoulder arthroscopy     bilateral  . Toe amputation     left big toe  . Fracture surgery     left lower leg  . Lasik      (Not in a hospital  admission) Allergies  Allergen Reactions  . Sulfonamide Derivatives Rash    History  Substance Use Topics  . Smoking status: Never Smoker   . Smokeless tobacco: Never Used  . Alcohol Use: 0.0 oz/week     Comment: on weekends    No family history on file.   Review of Systems  All other systems reviewed and are negative.    Objective:  Physical Exam cons alert well developed male, HENT wnl,lungs clear, heart RRR, abd soft nt +BS, r up extrem wnl, left shoulder sore with ROM, knees full 5-100 ROM with crepitus and pain,varus, calfs soft nt, legs NMVI, breast rectal exam deferred.  Vital signs in last 24 hours: @VSRANGES @  Labs:pending   Estimated Body mass index is 37.57 kg/(m^2) as calculated from the following:   Height as of 05/09/12: 6\' 0" (1.829 m).   Weight as of 05/09/12: 277 lb(125.646 kg).   Imaging Review Plain radiographs demonstrate severe degenerative joint disease of the bilaterally knee(s). The overall alignment ismild varus. The bone quality appears to be excellent for age and reported activity level.  Assessment/Plan:  End stage arthritis, left knee   The patient history, physical examination, clinical judgment of the provider and imaging studies are consistent with end stage degenerative joint disease of the left knee(s) and total knee arthroplasty is deemed medically necessary. The treatment options including medical management, injection therapy arthroscopy and arthroplasty were discussed at length. The risks and benefits of total knee arthroplasty were presented and reviewed. The risks due to aseptic loosening, infection, stiffness, patella  tracking problems, thromboembolic complications and other imponderables were discussed. The patient acknowledged the explanation, agreed to proceed with the plan and consent was signed. Patient is being admitted for inpatient treatment for surgery, pain control, PT, OT, prophylactic antibiotics, VTE prophylaxis, progressive  ambulation and ADL's and discharge planning. The patient is planning to be discharged home with home health services

## 2012-05-17 ENCOUNTER — Encounter (HOSPITAL_COMMUNITY)
Admission: RE | Disposition: A | Discharge status: Home Health | Payer: Self-pay | Source: Ambulatory Visit | Attending: Specialist

## 2012-05-17 ENCOUNTER — Encounter (HOSPITAL_COMMUNITY): Payer: Self-pay | Admitting: *Deleted

## 2012-05-17 ENCOUNTER — Encounter (HOSPITAL_COMMUNITY): Payer: Self-pay | Admitting: Anesthesiology

## 2012-05-17 ENCOUNTER — Ambulatory Visit (HOSPITAL_COMMUNITY): Payer: BC Managed Care – PPO | Admitting: Anesthesiology

## 2012-05-17 ENCOUNTER — Inpatient Hospital Stay (HOSPITAL_COMMUNITY)
Admission: RE | Admit: 2012-05-17 | Discharge: 2012-05-20 | DRG: 209 | Disposition: A | Discharge status: Home Health | Payer: BC Managed Care – PPO | Source: Ambulatory Visit | Attending: Specialist | Admitting: Specialist

## 2012-05-17 DIAGNOSIS — K648 Other hemorrhoids: Secondary | ICD-10-CM

## 2012-05-17 DIAGNOSIS — D62 Acute posthemorrhagic anemia: Secondary | ICD-10-CM | POA: Diagnosis not present

## 2012-05-17 DIAGNOSIS — E785 Hyperlipidemia, unspecified: Secondary | ICD-10-CM | POA: Diagnosis present

## 2012-05-17 DIAGNOSIS — M1710 Unilateral primary osteoarthritis, unspecified knee: Principal | ICD-10-CM | POA: Diagnosis present

## 2012-05-17 DIAGNOSIS — Z882 Allergy status to sulfonamides status: Secondary | ICD-10-CM

## 2012-05-17 DIAGNOSIS — R7401 Elevation of levels of liver transaminase levels: Secondary | ICD-10-CM

## 2012-05-17 DIAGNOSIS — Z8719 Personal history of other diseases of the digestive system: Secondary | ICD-10-CM

## 2012-05-17 DIAGNOSIS — Z01810 Encounter for preprocedural cardiovascular examination: Secondary | ICD-10-CM

## 2012-05-17 DIAGNOSIS — M129 Arthropathy, unspecified: Secondary | ICD-10-CM

## 2012-05-17 DIAGNOSIS — Z8249 Family history of ischemic heart disease and other diseases of the circulatory system: Secondary | ICD-10-CM

## 2012-05-17 DIAGNOSIS — K219 Gastro-esophageal reflux disease without esophagitis: Secondary | ICD-10-CM | POA: Diagnosis present

## 2012-05-17 DIAGNOSIS — Z6837 Body mass index (BMI) 37.0-37.9, adult: Secondary | ICD-10-CM

## 2012-05-17 DIAGNOSIS — K449 Diaphragmatic hernia without obstruction or gangrene: Secondary | ICD-10-CM | POA: Diagnosis present

## 2012-05-17 DIAGNOSIS — R197 Diarrhea, unspecified: Secondary | ICD-10-CM

## 2012-05-17 DIAGNOSIS — Z79899 Other long term (current) drug therapy: Secondary | ICD-10-CM

## 2012-05-17 DIAGNOSIS — M1990 Unspecified osteoarthritis, unspecified site: Secondary | ICD-10-CM

## 2012-05-17 DIAGNOSIS — R109 Unspecified abdominal pain: Secondary | ICD-10-CM

## 2012-05-17 DIAGNOSIS — S98119A Complete traumatic amputation of unspecified great toe, initial encounter: Secondary | ICD-10-CM

## 2012-05-17 DIAGNOSIS — E871 Hypo-osmolality and hyponatremia: Secondary | ICD-10-CM | POA: Diagnosis not present

## 2012-05-17 HISTORY — PX: TOTAL KNEE ARTHROPLASTY: SHX125

## 2012-05-17 LAB — TYPE AND SCREEN
ABO/RH(D): O POS
Antibody Screen: NEGATIVE

## 2012-05-17 SURGERY — ARTHROPLASTY, KNEE, TOTAL
Anesthesia: Spinal | Site: Knee | Laterality: Left | Wound class: Clean

## 2012-05-17 MED ORDER — HYDROMORPHONE HCL 2 MG PO TABS
2.0000 mg | ORAL_TABLET | ORAL | Status: DC | PRN
  Administered 2012-05-17: 4 mg via ORAL
  Administered 2012-05-17 (×2): 2 mg via ORAL
  Administered 2012-05-18 – 2012-05-19 (×7): 4 mg via ORAL
  Administered 2012-05-19: 2 mg via ORAL
  Administered 2012-05-19 – 2012-05-20 (×9): 4 mg via ORAL
  Filled 2012-05-17 (×11): qty 2
  Filled 2012-05-17: qty 1
  Filled 2012-05-17: qty 2
  Filled 2012-05-17: qty 1
  Filled 2012-05-17 (×6): qty 2

## 2012-05-17 MED ORDER — PROPOFOL 10 MG/ML IV EMUL
INTRAVENOUS | Status: DC | PRN
  Administered 2012-05-17: 100 ug/kg/min via INTRAVENOUS

## 2012-05-17 MED ORDER — ACETAMINOPHEN 10 MG/ML IV SOLN
INTRAVENOUS | Status: DC | PRN
  Administered 2012-05-17: 1000 mg via INTRAVENOUS

## 2012-05-17 MED ORDER — ACETAMINOPHEN 325 MG PO TABS: 650.0000 mg | ORAL_TABLET | Freq: Four times a day (QID) | ORAL | Status: DC | PRN

## 2012-05-17 MED ORDER — DEXAMETHASONE SODIUM PHOSPHATE 10 MG/ML IJ SOLN
INTRAMUSCULAR | Status: DC | PRN
  Administered 2012-05-17: 10 mg via INTRAVENOUS

## 2012-05-17 MED ORDER — CEFAZOLIN SODIUM 1-5 GM-% IV SOLN
INTRAVENOUS | Status: AC
  Filled 2012-05-17: qty 50

## 2012-05-17 MED ORDER — METHOCARBAMOL 500 MG PO TABS
500.0000 mg | ORAL_TABLET | Freq: Four times a day (QID) | ORAL | Status: DC | PRN
  Administered 2012-05-17 – 2012-05-20 (×6): 500 mg via ORAL
  Filled 2012-05-17 (×7): qty 1

## 2012-05-17 MED ORDER — PROMETHAZINE HCL 25 MG/ML IJ SOLN: 6.2500 mg | INTRAMUSCULAR | Status: DC | PRN

## 2012-05-17 MED ORDER — ACETAMINOPHEN 10 MG/ML IV SOLN
INTRAVENOUS | Status: AC
  Filled 2012-05-17: qty 100

## 2012-05-17 MED ORDER — MENTHOL 3 MG MT LOZG: 1.0000 | LOZENGE | OROMUCOSAL | Status: DC | PRN

## 2012-05-17 MED ORDER — DOCUSATE SODIUM 100 MG PO CAPS
100.0000 mg | ORAL_CAPSULE | Freq: Two times a day (BID) | ORAL | Status: DC
  Administered 2012-05-17 – 2012-05-20 (×6): 100 mg via ORAL

## 2012-05-17 MED ORDER — PANTOPRAZOLE SODIUM 40 MG PO TBEC
40.0000 mg | DELAYED_RELEASE_TABLET | Freq: Every day | ORAL | Status: DC
  Administered 2012-05-18 – 2012-05-20 (×3): 40 mg via ORAL
  Filled 2012-05-17 (×3): qty 1

## 2012-05-17 MED ORDER — HYDROMORPHONE HCL PF 1 MG/ML IJ SOLN
INTRAMUSCULAR | Status: AC
  Filled 2012-05-17: qty 1

## 2012-05-17 MED ORDER — METOCLOPRAMIDE HCL 5 MG/ML IJ SOLN
INTRAMUSCULAR | Status: DC | PRN
  Administered 2012-05-17: 10 mg via INTRAVENOUS

## 2012-05-17 MED ORDER — OMEPRAZOLE MAGNESIUM 20 MG PO TBEC: 20.0000 mg | DELAYED_RELEASE_TABLET | Freq: Every day | ORAL | Status: DC

## 2012-05-17 MED ORDER — ACETAMINOPHEN 10 MG/ML IV SOLN: 15.0000 mg/kg | Freq: Four times a day (QID) | INTRAVENOUS | Status: DC

## 2012-05-17 MED ORDER — FERROUS SULFATE 325 (65 FE) MG PO TABS
325.0000 mg | ORAL_TABLET | Freq: Three times a day (TID) | ORAL | Status: DC
  Administered 2012-05-17 – 2012-05-20 (×8): 325 mg via ORAL
  Filled 2012-05-17 (×11): qty 1

## 2012-05-17 MED ORDER — HYDROMORPHONE HCL PF 1 MG/ML IJ SOLN
0.5000 mg | INTRAMUSCULAR | Status: DC | PRN
  Administered 2012-05-17 – 2012-05-19 (×14): 1 mg via INTRAVENOUS
  Filled 2012-05-17 (×14): qty 1

## 2012-05-17 MED ORDER — HYDRALAZINE HCL 20 MG/ML IJ SOLN
INTRAMUSCULAR | Status: DC | PRN
  Administered 2012-05-17: 2 mg via INTRAVENOUS
  Administered 2012-05-17: 4 mg via INTRAVENOUS

## 2012-05-17 MED ORDER — POLYETHYLENE GLYCOL 3350 17 G PO PACK: 17.0000 g | PACK | Freq: Every day | ORAL | Status: DC | PRN

## 2012-05-17 MED ORDER — LABETALOL HCL 5 MG/ML IV SOLN
INTRAVENOUS | Status: DC | PRN
  Administered 2012-05-17: 5 mg via INTRAVENOUS
  Administered 2012-05-17 (×2): 10 mg via INTRAVENOUS
  Administered 2012-05-17: 5 mg via INTRAVENOUS
  Administered 2012-05-17: 15 mg via INTRAVENOUS
  Administered 2012-05-17: 10 mg via INTRAVENOUS
  Administered 2012-05-17: 5 mg via INTRAVENOUS

## 2012-05-17 MED ORDER — SODIUM CHLORIDE 0.9 % IR SOLN
Status: DC | PRN
  Administered 2012-05-17: 1000 mL

## 2012-05-17 MED ORDER — CEFAZOLIN SODIUM-DEXTROSE 2-3 GM-% IV SOLR
INTRAVENOUS | Status: AC
  Filled 2012-05-17: qty 50

## 2012-05-17 MED ORDER — POTASSIUM CHLORIDE IN NACL 20-0.9 MEQ/L-% IV SOLN
INTRAVENOUS | Status: DC
  Administered 2012-05-17 – 2012-05-19 (×2): via INTRAVENOUS
  Filled 2012-05-17 (×5): qty 1000

## 2012-05-17 MED ORDER — ZOLPIDEM TARTRATE 5 MG PO TABS
5.0000 mg | ORAL_TABLET | Freq: Every evening | ORAL | Status: DC | PRN
  Administered 2012-05-18 – 2012-05-19 (×2): 5 mg via ORAL
  Filled 2012-05-17 (×2): qty 1

## 2012-05-17 MED ORDER — CEFAZOLIN SODIUM-DEXTROSE 2-3 GM-% IV SOLR
2.0000 g | Freq: Four times a day (QID) | INTRAVENOUS | Status: AC
  Administered 2012-05-17 – 2012-05-18 (×2): 2 g via INTRAVENOUS
  Filled 2012-05-17 (×4): qty 50

## 2012-05-17 MED ORDER — CEFAZOLIN SODIUM 10 G IJ SOLR
3.0000 g | INTRAMUSCULAR | Status: AC
  Administered 2012-05-17: 3 g via INTRAVENOUS
  Filled 2012-05-17: qty 3000

## 2012-05-17 MED ORDER — BUPIVACAINE-EPINEPHRINE PF 0.25-1:200000 % IJ SOLN
INTRAMUSCULAR | Status: DC | PRN
  Administered 2012-05-17: 50 mL

## 2012-05-17 MED ORDER — ENOXAPARIN SODIUM 30 MG/0.3ML ~~LOC~~ SOLN
30.0000 mg | Freq: Two times a day (BID) | SUBCUTANEOUS | Status: DC
  Administered 2012-05-18 – 2012-05-20 (×5): 30 mg via SUBCUTANEOUS
  Filled 2012-05-17 (×7): qty 0.3

## 2012-05-17 MED ORDER — PROPOFOL 10 MG/ML IV BOLUS
INTRAVENOUS | Status: DC | PRN
  Administered 2012-05-17: 50 mg via INTRAVENOUS

## 2012-05-17 MED ORDER — HYDROMORPHONE HCL PF 1 MG/ML IJ SOLN
0.2500 mg | INTRAMUSCULAR | Status: DC | PRN
  Administered 2012-05-17 (×2): 0.5 mg via INTRAVENOUS

## 2012-05-17 MED ORDER — ACETAMINOPHEN 10 MG/ML IV SOLN
1000.0000 mg | Freq: Four times a day (QID) | INTRAVENOUS | Status: AC
  Administered 2012-05-17 – 2012-05-18 (×4): 1000 mg via INTRAVENOUS
  Filled 2012-05-17 (×7): qty 100

## 2012-05-17 MED ORDER — FENTANYL CITRATE 0.05 MG/ML IJ SOLN
INTRAMUSCULAR | Status: DC | PRN
  Administered 2012-05-17 (×4): 25 ug via INTRAVENOUS

## 2012-05-17 MED ORDER — PHENOL 1.4 % MT LIQD: 1.0000 | OROMUCOSAL | Status: DC | PRN

## 2012-05-17 MED ORDER — ONDANSETRON HCL 4 MG/2ML IJ SOLN
INTRAMUSCULAR | Status: DC | PRN
  Administered 2012-05-17: 4 mg via INTRAVENOUS

## 2012-05-17 MED ORDER — BUPIVACAINE IN DEXTROSE 0.75-8.25 % IT SOLN
INTRATHECAL | Status: DC | PRN
  Administered 2012-05-17: 15 mg via INTRATHECAL

## 2012-05-17 MED ORDER — SODIUM CHLORIDE 0.9 % IV SOLN: INTRAVENOUS | Status: DC

## 2012-05-17 MED ORDER — ONDANSETRON HCL 4 MG/2ML IJ SOLN: 4.0000 mg | Freq: Four times a day (QID) | INTRAMUSCULAR | Status: DC | PRN

## 2012-05-17 MED ORDER — KETOROLAC TROMETHAMINE 30 MG/ML IJ SOLN
INTRAMUSCULAR | Status: AC
  Filled 2012-05-17: qty 1

## 2012-05-17 MED ORDER — BUPIVACAINE-EPINEPHRINE 0.25% -1:200000 IJ SOLN
INTRAMUSCULAR | Status: AC
  Filled 2012-05-17: qty 1

## 2012-05-17 MED ORDER — FLEET ENEMA 7-19 GM/118ML RE ENEM: 1.0000 | ENEMA | Freq: Once | RECTAL | Status: AC | PRN

## 2012-05-17 MED ORDER — LACTATED RINGERS IV SOLN
INTRAVENOUS | Status: DC
  Administered 2012-05-17 (×2): via INTRAVENOUS
  Administered 2012-05-17: 1000 mL via INTRAVENOUS

## 2012-05-17 MED ORDER — BUPIVACAINE LIPOSOME 1.3 % IJ SUSP
20.0000 mL | Freq: Once | INTRAMUSCULAR | Status: DC
  Filled 2012-05-17: qty 20

## 2012-05-17 MED ORDER — ATORVASTATIN CALCIUM 10 MG PO TABS
10.0000 mg | ORAL_TABLET | Freq: Every day | ORAL | Status: DC
  Administered 2012-05-17 – 2012-05-19 (×3): 10 mg via ORAL
  Filled 2012-05-17 (×4): qty 1

## 2012-05-17 MED ORDER — ALUM & MAG HYDROXIDE-SIMETH 200-200-20 MG/5ML PO SUSP: 30.0000 mL | ORAL | Status: DC | PRN

## 2012-05-17 MED ORDER — CELECOXIB 200 MG PO CAPS
200.0000 mg | ORAL_CAPSULE | Freq: Two times a day (BID) | ORAL | Status: DC
  Administered 2012-05-17 – 2012-05-20 (×6): 200 mg via ORAL
  Filled 2012-05-17 (×7): qty 1

## 2012-05-17 MED ORDER — FENTANYL CITRATE 0.05 MG/ML IJ SOLN
INTRAMUSCULAR | Status: AC
  Filled 2012-05-17: qty 2

## 2012-05-17 MED ORDER — BISACODYL 10 MG RE SUPP: 10.0000 mg | Freq: Every day | RECTAL | Status: DC | PRN

## 2012-05-17 MED ORDER — ACETAMINOPHEN 650 MG RE SUPP: 650.0000 mg | Freq: Four times a day (QID) | RECTAL | Status: DC | PRN

## 2012-05-17 MED ORDER — MIDAZOLAM HCL 5 MG/5ML IJ SOLN
INTRAMUSCULAR | Status: DC | PRN
  Administered 2012-05-17: 1 mg via INTRAVENOUS
  Administered 2012-05-17: 2 mg via INTRAVENOUS
  Administered 2012-05-17: 1 mg via INTRAVENOUS

## 2012-05-17 MED ORDER — KETOROLAC TROMETHAMINE 30 MG/ML IJ SOLN
INTRAMUSCULAR | Status: DC | PRN
  Administered 2012-05-17: 30 mg via INTRAVENOUS

## 2012-05-17 MED ORDER — POVIDONE-IODINE 7.5 % EX SOLN: Freq: Once | CUTANEOUS | Status: DC

## 2012-05-17 MED ORDER — METHOCARBAMOL 100 MG/ML IJ SOLN
500.0000 mg | Freq: Four times a day (QID) | INTRAVENOUS | Status: DC | PRN
  Administered 2012-05-17: 500 mg via INTRAVENOUS
  Filled 2012-05-17: qty 5

## 2012-05-17 MED ORDER — FENTANYL CITRATE 0.05 MG/ML IJ SOLN
50.0000 ug | INTRAMUSCULAR | Status: DC | PRN
  Administered 2012-05-17: 100 ug via INTRAVENOUS

## 2012-05-17 MED ORDER — HYDROMORPHONE HCL PF 1 MG/ML IJ SOLN
0.5000 mg | INTRAMUSCULAR | Status: DC | PRN
  Administered 2012-05-17: 1 mg via INTRAVENOUS
  Filled 2012-05-17: qty 1

## 2012-05-17 SURGICAL SUPPLY — 65 items
BAG SPEC THK2 15X12 ZIP CLS (MISCELLANEOUS) ×2
BAG ZIPLOCK 12X15 (MISCELLANEOUS) ×4 IMPLANT
BANDAGE ELASTIC 4 VELCRO ST LF (GAUZE/BANDAGES/DRESSINGS) ×2 IMPLANT
BANDAGE ELASTIC 6 VELCRO ST LF (GAUZE/BANDAGES/DRESSINGS) ×2 IMPLANT
BANDAGE ESMARK 6X9 LF (GAUZE/BANDAGES/DRESSINGS) ×1 IMPLANT
BANDAGE GAUZE ELAST BULKY 4 IN (GAUZE/BANDAGES/DRESSINGS) ×3 IMPLANT
BLADE SAG 18X100X1.27 (BLADE) ×2 IMPLANT
BLADE SAW SGTL 13.0X1.19X90.0M (BLADE) ×2 IMPLANT
BNDG CMPR 9X6 STRL LF SNTH (GAUZE/BANDAGES/DRESSINGS) ×1
BNDG ESMARK 6X9 LF (GAUZE/BANDAGES/DRESSINGS) ×2
CEMENT HV SMART SET (Cement) ×4 IMPLANT
CLOTH BEACON ORANGE TIMEOUT ST (SAFETY) ×2 IMPLANT
CLSR STERI-STRIP ANTIMIC 1/2X4 (GAUZE/BANDAGES/DRESSINGS) ×1 IMPLANT
CUFF TOURN SGL QUICK 34 (TOURNIQUET CUFF) ×2
CUFF TRNQT CYL 34X4X40X1 (TOURNIQUET CUFF) ×1 IMPLANT
DRAPE EXTREMITY T 121X128X90 (DRAPE) ×2 IMPLANT
DRAPE LG THREE QUARTER DISP (DRAPES) ×2 IMPLANT
DRAPE POUCH INSTRU U-SHP 10X18 (DRAPES) ×2 IMPLANT
DRAPE U-SHAPE 47X51 STRL (DRAPES) ×2 IMPLANT
DRSG PAD ABDOMINAL 8X10 ST (GAUZE/BANDAGES/DRESSINGS) ×3 IMPLANT
DURAPREP 26ML APPLICATOR (WOUND CARE) ×2 IMPLANT
ELECT REM PT RETURN 9FT ADLT (ELECTROSURGICAL) ×2
ELECTRODE REM PT RTRN 9FT ADLT (ELECTROSURGICAL) ×1 IMPLANT
EVACUATOR 1/8 PVC DRAIN (DRAIN) ×2 IMPLANT
FACESHIELD LNG OPTICON STERILE (SAFETY) ×10 IMPLANT
GAUZE XEROFORM 2X2 STRL (GAUZE/BANDAGES/DRESSINGS) ×2 IMPLANT
GLOVE ECLIPSE 8.0 STRL XLNG CF (GLOVE) ×2 IMPLANT
GLOVE SURG ORTHO 8.0 STRL STRW (GLOVE) ×2 IMPLANT
GLOVE SURG ORTHO 9.0 STRL STRW (GLOVE) ×2 IMPLANT
GOWN PREVENTION PLUS XLARGE (GOWN DISPOSABLE) ×6 IMPLANT
GOWN STRL NON-REIN LRG LVL3 (GOWN DISPOSABLE) ×2 IMPLANT
GOWN STRL REIN XL XLG (GOWN DISPOSABLE) ×2 IMPLANT
HANDPIECE INTERPULSE COAX TIP (DISPOSABLE) ×2
IMMOBILIZER KNEE 20 (SOFTGOODS)
IMMOBILIZER KNEE 20 THIGH 36 (SOFTGOODS) IMPLANT
IMMOBILIZER KNEE 22 UNIV (SOFTGOODS) ×1 IMPLANT
KIT BASIN OR (CUSTOM PROCEDURE TRAY) ×2 IMPLANT
NDL SAFETY ECLIPSE 18X1.5 (NEEDLE) ×1 IMPLANT
NEEDLE HYPO 18GX1.5 SHARP (NEEDLE) ×2
NS IRRIG 1000ML POUR BTL (IV SOLUTION) ×2 IMPLANT
PACK TOTAL JOINT (CUSTOM PROCEDURE TRAY) ×2 IMPLANT
POSITIONER SURGICAL ARM (MISCELLANEOUS) ×2 IMPLANT
SET HNDPC FAN SPRY TIP SCT (DISPOSABLE) ×1 IMPLANT
SET PAD KNEE POSITIONER (MISCELLANEOUS) ×2 IMPLANT
SPONGE GAUZE 4X4 12PLY (GAUZE/BANDAGES/DRESSINGS) ×2 IMPLANT
SPONGE LAP 18X18 X RAY DECT (DISPOSABLE) IMPLANT
SPONGE SURGIFOAM ABS GEL 100 (HEMOSTASIS) ×2 IMPLANT
STOCKINETTE 6  STRL (DRAPES) ×1
STOCKINETTE 6 STRL (DRAPES) ×1 IMPLANT
STRIP CLOSURE SKIN 1/2X4 (GAUZE/BANDAGES/DRESSINGS) ×4 IMPLANT
SUCTION FRAZIER 12FR DISP (SUCTIONS) ×2 IMPLANT
SUT BONE WAX W31G (SUTURE) ×2 IMPLANT
SUT MNCRL AB 3-0 PS2 18 (SUTURE) ×2 IMPLANT
SUT VIC AB 1 CT1 27 (SUTURE) ×14
SUT VIC AB 1 CT1 27XBRD ANTBC (SUTURE) ×7 IMPLANT
SUT VIC AB 2-0 CT1 27 (SUTURE) ×4
SUT VIC AB 2-0 CT1 TAPERPNT 27 (SUTURE) ×2 IMPLANT
SUT VLOC 180 0 24IN GS25 (SUTURE) ×2 IMPLANT
SYR 50ML LL SCALE MARK (SYRINGE) ×2 IMPLANT
TAPE STRIPS DRAPE STRL (GAUZE/BANDAGES/DRESSINGS) ×2 IMPLANT
TOWEL OR 17X26 10 PK STRL BLUE (TOWEL DISPOSABLE) ×6 IMPLANT
TOWER CARTRIDGE SMART MIX (DISPOSABLE) ×2 IMPLANT
TRAY FOLEY CATH 14FRSI W/METER (CATHETERS) ×2 IMPLANT
WATER STERILE IRR 1500ML POUR (IV SOLUTION) ×2 IMPLANT
WRAP KNEE MAXI GEL POST OP (GAUZE/BANDAGES/DRESSINGS) ×2 IMPLANT

## 2012-05-17 NOTE — Transfer of Care (Signed)
Immediate Anesthesia Transfer of Care Note  Patient: Russell Sanford  Procedure(s) Performed: Procedure(s) (LRB) with comments: TOTAL KNEE ARTHROPLASTY (Left)  Patient Location: PACU  Anesthesia Type:MAC and Spinal  Level of Consciousness: awake, patient cooperative and responds to stimulation, drowsy  Airway & Oxygen Therapy: Patient Spontanous Breathing and Patient connected to face mask oxygen  Post-op Assessment: Report given to PACU RN, Post -op Vital signs reviewed and stable and Patient moving all extremities X 4  Post vital signs: Reviewed and stable  Complications: No apparent anesthesia complications

## 2012-05-17 NOTE — Anesthesia Preprocedure Evaluation (Addendum)
Anesthesia Evaluation  Patient identified by MRN, date of birth, ID band Patient awake    Reviewed: Allergy & Precautions, H&P , NPO status , Patient's Chart, lab work & pertinent test results  Airway Mallampati: II TM Distance: <3 FB Neck ROM: Full    Dental No notable dental hx.    Pulmonary neg pulmonary ROS,  breath sounds clear to auscultation  Pulmonary exam normal       Cardiovascular negative cardio ROS  Rhythm:Regular Rate:Normal     Neuro/Psych negative neurological ROS  negative psych ROS   GI/Hepatic Neg liver ROS, GERD-  Medicated,  Endo/Other  Morbid obesity  Renal/GU negative Renal ROS  negative genitourinary   Musculoskeletal negative musculoskeletal ROS (+)   Abdominal   Peds negative pediatric ROS (+)  Hematology negative hematology ROS (+)   Anesthesia Other Findings   Reproductive/Obstetrics negative OB ROS                           Anesthesia Physical Anesthesia Plan  ASA: II  Anesthesia Plan: Spinal   Post-op Pain Management:    Induction: Intravenous  Airway Management Planned: Nasal Cannula  Additional Equipment:   Intra-op Plan:   Post-operative Plan:   Informed Consent: I have reviewed the patients History and Physical, chart, labs and discussed the procedure including the risks, benefits and alternatives for the proposed anesthesia with the patient or authorized representative who has indicated his/her understanding and acceptance.     Plan Discussed with: CRNA and Surgeon  Anesthesia Plan Comments:         Anesthesia Quick Evaluation

## 2012-05-17 NOTE — Op Note (Signed)
DATE OF SURGERY:  05/17/2012  TIME: 3:10 PM  PATIENT NAME:  Russell Sanford    AGE: 57 y.o.   PRE-OPERATIVE DIAGNOSIS:  LEFT KNEE OA   POST-OPERATIVE DIAGNOSIS:  left knee oa  PROCEDURE:  Procedure(s): TOTAL KNEE ARTHROPLASTY  SURGEON:  Jahziah Simonin,Bostyn ANDREW  ASSISTANT:  Oneida Alar, PA-C, present and scrubbed throughout the case, critical for assistance with exposure, retraction, instrumentation, and closure.  OPERATIVE IMPLANTS: Depuy PFC Sigma Rotating Platform.  Femur size 6, Tibia size 6, Patella size 3-peg oval button, with a  mm polyethylene insert.   PREOPERATIVE INDICATIONS:   Russell Sanford is a 57 y.o. year old male with end stage bone on bone arthritis of the knee who failed conservative treatment and elected for Total Knee Arthroplasty.   The risks, benefits, and alternatives were discussed at length including but not limited to the risks of infection, bleeding, nerve injury, stiffness, blood clots, the need for revision surgery, cardiopulmonary complications, among others, and they were willing to proceed.  OPERATIVE DESCRIPTION:  The patient was brought to the operative room and placed in a supine position.  Spinal anesthesia was administered.  IV antibiotics were given.  The lower extremity was prepped and draped in the usual sterile fashion.  Time out was performed.  The leg was elevated and exsanguinated and the tourniquet was inflated.  Anterior quadriceps tendon splitting approach was performed.  The patella was retracted and osteophytes were removed.  The anterior horn of the medial and lateral meniscus was removed and cruciate ligaments resected.   The distal femur was opened with the drill and the intramedullary distal femoral cutting jig was utilized, set at 5 degrees resecting 10 mm off the distal femur.  Care was taken to protect the collateral ligaments.  The distal femoral sizing jig was applied, taking care to avoid notching.  Then the 4-in-1 cutting jig  was applied and the anterior and posterior femur was cut, along with the chamfer cuts.    Then the extramedullary tibial cutting jig was utilized making the appropriate cut using the anterior tibial crest as a reference building in appropriate posterior slope.  Care was taken during the cut to protect the medial and collateral ligaments.  The proximal tibia was removed along with the posterior horns of the menisci.   The posterior medial femoral osteophytes and posterior lateral femoral osteophytes were removed.    The flexion gap was then measured and was symmetric with the extension gap, measured at   I completed the distal femoral preparation using the appropriate jig to prepare the box.  The patella was then measured, and cut with the saw.    The proximal tibia sized and prepared accordingly with the reamer and the punch, and then all components were trialed with the trial insert.  The knee was found to have excellent balance and full motion.    The above named components were then cemented into place and all excess cement was removed.  The trial polyethylene component was in place during cementation, and then was exchanged for the real polyethylene component.    The knee was easily taken through a range of motion and the patella tracked well and the knee irrigated copiously and the parapatellar and subcutaneous tissue closed with vicryl, and monocryl with steri strips for the skin.  The arthrotomy was closed at 90 of flexion. The wounds were dressed with sterile gauze and the tourniquet released and the patient was awakened and returned to the PACU in  stable and satisfactory condition.  There were no complications.  Total tourniquet time was minutes.VLock capsule closure. Pperiosteal injection of 50cc 0.25% marcaine and 30mg  toradol.

## 2012-05-17 NOTE — Plan of Care (Signed)
Problem: Consults Goal: Diagnosis- Total Joint Replacement Primary Total Knee     

## 2012-05-17 NOTE — Anesthesia Postprocedure Evaluation (Signed)
  Anesthesia Post-op Note  Patient: Russell Sanford  Procedure(s) Performed: Procedure(s) (LRB): TOTAL KNEE ARTHROPLASTY (Left)  Patient Location: PACU  Anesthesia Type: Spinal  Level of Consciousness: awake and alert   Airway and Oxygen Therapy: Patient Spontanous Breathing  Post-op Pain: mild  Post-op Assessment: Post-op Vital signs reviewed, Patient's Cardiovascular Status Stable, Respiratory Function Stable, Patent Airway and No signs of Nausea or vomiting  Last Vitals:  Filed Vitals:   05/17/12 1536  BP:   Pulse:   Temp: 36.8 C  Resp:     Post-op Vital Signs: stable   Complications: No apparent anesthesia complications

## 2012-05-17 NOTE — H&P (Signed)
TOTAL KNEE ADMISSION H&P  Patient is being admitted for left total knee arthroplasty.  Subjective:  Chief Complaint:left knee pain.  HPI: Russell Sanford, 57 y.o. male, has a history of pain and functional disability in the left knee due to arthritis and has failed non-surgical conservative treatments for greater than 12 weeks to includeNSAID's and/or analgesics, corticosteriod injections, viscosupplementation injections, supervised PT with diminished ADL's post treatment and activity modification.  Onset of symptoms was gradual, starting 3 years ago with gradually worsening course since that time. The patient noted knee scopes on the left knee(s).  Patient currently rates pain in the left knee(s) at 7 out of 10 with activity. Patient has worsening of pain with activity and weight bearing.  Patient has evidence of subchondral sclerosis, periarticular osteophytes and joint space narrowing by imaging studies. This patient has had conservative treatment. There is no active infection.  Patient Active Problem List   Diagnosis Date Noted  . Preop cardiovascular exam 03/28/2012  . Family history of heart disease in male family member before age 38 05/02/2011  . Arthritis 05/02/2011  . Hyperlipidemia LDL goal <100 05/02/2011  . HYPERLIPIDEMIA 04/21/2010  . ARTHRITIS 04/21/2010  . NONSPEC ELEVATION OF LEVELS OF TRANSAMINASE/LDH 04/21/2010  . INTERNAL HEMORRHOIDS 04/20/2010  . GERD 04/20/2010  . DIARRHEA 04/20/2010  . ABDOMINAL PAIN, UNSPECIFIED SITE 04/20/2010  . BARRETT'S ESOPHAGUS, HX OF 04/20/2010   Past Medical History  Diagnosis Date  . HH (hiatus hernia)   . Barrett's esophagus   . Arthritis   . GERD (gastroesophageal reflux disease)   . Hypercholesteremia     Past Surgical History  Procedure Date  . Knee arthroscopy     bilateral  . Shoulder arthroscopy     bilateral  . Toe amputation     left big toe  . Fracture surgery     left lower leg  . Lasik     Prescriptions prior  to admission  Medication Sig Dispense Refill  . celecoxib (CELEBREX) 200 MG capsule Take 200 mg by mouth 2 (two) times daily.      . CRESTOR 10 MG tablet take 1 tablet by mouth once daily  30 each  0  . omeprazole (PRILOSEC OTC) 20 MG tablet Take 20 mg by mouth daily.      . traMADol (ULTRAM) 50 MG tablet Take 50 mg by mouth every 6 (six) hours as needed. Pain       Allergies  Allergen Reactions  . Sulfonamide Derivatives Rash    History  Substance Use Topics  . Smoking status: Never Smoker   . Smokeless tobacco: Never Used  . Alcohol Use: 0.0 oz/week     Comment: on weekends    History reviewed. No pertinent family history.   Review of Systems  All other systems reviewed and are negative.    Objective:  Physical Exam cons alert well developed male, HENT wnl,lungs clear, heart RRR, abd soft nt +BS, r up extrem wnl, left shoulder sore with ROM, knees full 5-100 ROM with crepitus and pain,varus, calfs soft nt, legs NMVI, breast rectal exam deferred.  Vital signs in last 24 hours: @VSRANGES @  Labs:pending   Estimated Body mass index is 37.03 kg/(m^2) as calculated from the following:   Height as of 03/28/12: 6\' 0" (1.829 m).   Weight as of 03/28/12: 273 lb(123.832 kg).   Imaging Review Plain radiographs demonstrate severe degenerative joint disease of the bilaterally knee(s). The overall alignment ismild varus. The bone quality appears to be excellent  for age and reported activity level.  Assessment/Plan:  End stage arthritis, left knee   The patient history, physical examination, clinical judgment of the provider and imaging studies are consistent with end stage degenerative joint disease of the left knee(s) and total knee arthroplasty is deemed medically necessary. The treatment options including medical management, injection therapy arthroscopy and arthroplasty were discussed at length. The risks and benefits of total knee arthroplasty were presented and reviewed. The  risks due to aseptic loosening, infection, stiffness, patella tracking problems, thromboembolic complications and other imponderables were discussed. The patient acknowledged the explanation, agreed to proceed with the plan and consent was signed. Patient is being admitted for inpatient treatment for surgery, pain control, PT, OT, prophylactic antibiotics, VTE prophylaxis, progressive ambulation and ADL's and discharge planning. The patient is planning to be discharged home with home health services

## 2012-05-17 NOTE — Preoperative (Signed)
Beta Blockers   Reason not to administer Beta Blockers:Not Applicable, not on home BB 

## 2012-05-17 NOTE — Anesthesia Procedure Notes (Signed)

## 2012-05-18 LAB — BASIC METABOLIC PANEL
BUN: 14 mg/dL (ref 6–23)
CO2: 24 mEq/L (ref 19–32)
Chloride: 97 mEq/L (ref 96–112)
Creatinine, Ser: 0.79 mg/dL (ref 0.50–1.35)
Glucose, Bld: 146 mg/dL — ABNORMAL HIGH (ref 70–99)
Potassium: 4.1 mEq/L (ref 3.5–5.1)

## 2012-05-18 LAB — CBC
HCT: 36.6 % — ABNORMAL LOW (ref 39.0–52.0)
Hemoglobin: 12.7 g/dL — ABNORMAL LOW (ref 13.0–17.0)
MCV: 91 fL (ref 78.0–100.0)
RBC: 4.02 MIL/uL — ABNORMAL LOW (ref 4.22–5.81)
RDW: 12 % (ref 11.5–15.5)
WBC: 16.6 10*3/uL — ABNORMAL HIGH (ref 4.0–10.5)

## 2012-05-18 NOTE — Evaluation (Signed)
Physical Therapy Evaluation Patient Details Name: Russell Sanford MRN: [id number] DOB: February 14, 1956 Today's Date: 05/18/2012 Time: 2536-6440 PT Time Calculation (min): 39 min  PT Assessment / Plan / Recommendation Clinical Impression  pt is s/p L TKA and will benefit form PT to maximize independence for home with wife and f/u HHPT    PT Assessment  Patient needs continued PT services    Follow Up Recommendations  Home health PT    Does the patient have the potential to tolerate intense rehabilitation      Barriers to Discharge        Equipment Recommendations  Rolling walker with 5" wheels    Recommendations for Other Services     Frequency 7X/week    Precautions / Restrictions Precautions Precautions: Knee Required Braces or Orthoses: Knee Immobilizer - Left Restrictions LLE Weight Bearing: Weight bearing as tolerated   Pertinent Vitals/Pain VSS      Mobility  Bed Mobility Bed Mobility: Supine to Sit Supine to Sit: 4: Min assist Details for Bed Mobility Assistance: cues for technique Transfers Transfers: Sit to Stand;Stand to Sit Sit to Stand: With upper extremity assist;From elevated surface;4: Min assist Stand to Sit: 4: Min assist Details for Transfer Assistance: cues for LLE management and hand placement Ambulation/Gait Ambulation/Gait Assistance: 4: Min assist;4: Min guard Ambulation Distance (Feet): 80 Feet Assistive device: Rolling walker Gait Pattern: Step-to pattern;Antalgic    Shoulder Instructions     Exercises Total Joint Exercises Ankle Circles/Pumps: AROM;Both;20 reps Quad Sets: AROM;Both;5 reps Other Exercises Other Exercises: pt instructed in use of IS due white count being elevated ( per request of PA) Pt completed 10 reps with accuracy   PT Diagnosis: Difficulty walking  PT Problem List: Decreased strength;Decreased range of motion;Decreased activity tolerance;Decreased mobility;Decreased knowledge of use of DME PT Treatment  Interventions: DME instruction;Gait training;Stair training;Functional mobility training;Therapeutic activities;Therapeutic exercise;Balance training;Patient/family education   PT Goals Acute Rehab PT Goals PT Goal Formulation: With patient Time For Goal Achievement: 05/25/12 Potential to Achieve Goals: Good Pt will go Supine/Side to Sit: with supervision;with modified independence PT Goal: Supine/Side to Sit - Progress: Goal set today Pt will go Sit to Supine/Side: with supervision;with modified independence PT Goal: Sit to Supine/Side - Progress: Goal set today Pt will go Sit to Stand: with supervision PT Goal: Sit to Stand - Progress: Goal set today Pt will go Stand to Sit: with supervision PT Goal: Stand to Sit - Progress: Goal set today Pt will Ambulate: >150 feet;with supervision;with least restrictive assistive device PT Goal: Ambulate - Progress: Goal set today Pt will Go Up / Down Stairs: 3-5 stairs;with min assist;with least restrictive assistive device PT Goal: Up/Down Stairs - Progress: Goal set today Pt will Perform Home Exercise Program: with supervision, verbal cues required/provided PT Goal: Perform Home Exercise Program - Progress: Goal set today  Visit Information  Last PT Received On: 05/18/12    Subjective Data  Subjective: I have been here man y times Patient Stated Goal: home   Prior Functioning  Home Living Lives With: Spouse Available Help at Discharge: Family Type of Home: House Home Access: Stairs to enter Secretary/administrator of Steps: 3 Home Layout: One level Bathroom Toilet: Handicapped height How Accessible: Accessible via walker;Other (comment);Accessible via wheelchair (wide doors) Home Adaptive Equipment: Crutches Prior Function Level of Independence: Independent Able to Take Stairs?: Yes Driving: Yes 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 Communication: No difficulties    Cognition  Overall  Cognitive Status: Appears within functional  limits for tasks assessed/performed Arousal/Alertness: Awake/alert Orientation Level: Appears intact for tasks assessed Behavior During Session: [facility] for tasks performed    Extremity/Trunk Assessment Right Upper Extremity Assessment RUE ROM/Strength/Tone: [facility] for tasks assessed Left Upper Extremity Assessment LUE ROM/Strength/Tone: WFL for tasks assessed Right Lower Extremity Assessment RLE ROM/Strength/Tone: [facility] for tasks assessed Left Lower Extremity Assessment LLE ROM/Strength/Tone: Deficits;Due to pain LLE ROM/Strength/Tone Deficits: ankle WFL; able to do SLR with ~20degree quad lag Trunk Assessment Trunk Assessment: Normal   Balance    End of Session PT - End of Session Equipment Utilized During Treatment: Left knee immobilizer Activity Tolerance: Patient tolerated treatment well Patient left: in chair;with call bell/phone within reach Nurse Communication: Mobility status  GP     [facility] 05/18/2012, 10:35 AM

## 2012-05-18 NOTE — Plan of Care (Signed)
Problem: Consults Goal: Diagnosis- Total Joint Replacement Outcome: Completed/Met Date Met:  05/18/12 Primary Total Knee LEFT

## 2012-05-18 NOTE — Progress Notes (Signed)
Subjective: 1 Day Post-Op Procedure(s) (LRB): TOTAL KNEE ARTHROPLASTY (Left) Patient reports pain as moderate.  No complaints . Reports drinking a lot of fluids. Denies dysuria.   Objective: Vital signs in last 24 hours: Temp:  [97.4 F (36.3 C)-98.8 F (37.1 C)] 98.5 F (36.9 C) (02/01 0600) Pulse Rate:  [64-77] 75  (02/01 0600) Resp:  [16-20] 18  (02/01 0800) BP: (107-131)/(60-84) 107/60 mmHg (02/01 0600) SpO2:  [96 %-99 %] 96 % (02/01 0800) Weight:  [125.646 kg (277 lb)] 125.646 kg (277 lb) (01/31 1920)  Intake/Output from previous day: 01/31 0701 - 02/01 0700 In: 5072.5 [P.O.:1440; I.V.:3332.5; IV Piggyback:300] Out: 2660 [Urine:2490; Drains:120; Blood:50] Intake/Output this shift:     Basename 05/18/12 0424  HGB 12.7*    Basename 05/18/12 0424  WBC 16.6*  RBC 4.02*  HCT 36.6*  PLT 202    Basename 05/18/12 0424  NA 131*  K 4.1  CL 97  CO2 24  BUN 14  CREATININE 0.79  GLUCOSE 146*  CALCIUM 8.8   No results found for this basename: LABPT:2,INR:2 in the last 72 hours  General: awake and alert no distress Left lower leg: Dorsal pedal pulse present calf supple and non tender. Dressing clean dry and intact.  Assessment/Plan: 1 Day Post-Op Procedure(s) (LRB): TOTAL KNEE ARTHROPLASTY (Left) Will leave drain intact as was clamped off until this AM and since then 80 cc ot total 120 cc since surgery. Will change dressing and pull drain in AM. Elevated WBC afebrile will monitor encouraged incentive spirometry  Hyponatremia will fluid restrict today check labs in AM  [place], Russell Sanford 05/18/2012, 9:35 AM

## 2012-05-18 NOTE — Progress Notes (Signed)
05/18/12 1700  PT Visit Information  Last PT Received On 05/18/12  Assistance Needed +1  PT Time Calculation  PT Start Time 1412  PT Stop Time 1438  PT Time Calculation (min) 26 min  Subjective Data  Patient Stated Goal home  Precautions  Precautions Knee  Bed Mobility  Bed Mobility Supine to Sit;Sit to Supine  Supine to Sit 4: Min guard  Sit to Supine 4: Min guard  Details for Bed Mobility Assistance cues for technique  Transfers  Transfers Sit to Stand;Stand to Sit  Sit to Stand 4: Min guard  Stand to Sit 4: Min guard  Details for Transfer Assistance cues for LLE management and hand placement  Ambulation/Gait  Ambulation/Gait Assistance 4: Min guard;5: Supervision  Ambulation Distance (Feet) 170 Feet  Assistive device Rolling walker  Ambulation/Gait Assistance Details cues for step through, RW safety  Gait Pattern Step-to pattern;Step-through pattern  Total Joint Exercises  Ankle Circles/Pumps AROM;Both;20 reps  Quad Sets AROM;Both;10 reps  Heel Slides AAROM;Left;10 reps  Hip ABduction/ADduction AROM;Left;10 reps  Straight Leg Raises AROM;Left;10 reps  PT - Assessment/Plan  PT Plan Discharge plan remains appropriate;Frequency remains appropriate  PT Frequency 7X/week  Follow Up Recommendations Home health PT  PT equipment Rolling walker with 5" wheels  Acute Rehab PT Goals  PT Goal Formulation With patient  Time For Goal Achievement 05/25/12  Potential to Achieve Goals Good  Pt will go Supine/Side to Sit with supervision;with modified independence  PT Goal: Supine/Side to Sit - Progress Progressing toward goal  Pt will go Sit to Supine/Side with supervision;with modified independence  PT Goal: Sit to Supine/Side - Progress Progressing toward goal  Pt will go Sit to Stand with supervision  PT Goal: Sit to Stand - Progress Progressing toward goal  Pt will go Stand to Sit with supervision  PT Goal: Stand to Sit - Progress Progressing toward goal  Pt will Ambulate  >150 feet;with supervision;with least restrictive assistive device  PT Goal: Ambulate - Progress Progressing toward goal  PT General Charges  $$ ACUTE PT VISIT 1 Procedure  PT Treatments  $Gait Training 8-22 mins  $Therapeutic Exercise 8-22 mins

## 2012-05-18 NOTE — Progress Notes (Signed)
OT Cancellation Note  Patient Details Name: Russell Sanford MRN: [id number] DOB: 04/05/1956   Cancelled Treatment:    Reason Eval/Treat Not Completed: Other (comment) (screen:  no needs. )  Susy Placzek 05/18/2012, 12:45 PM Marica Otter, OTR/L [phone] 05/18/2012

## 2012-05-19 LAB — BASIC METABOLIC PANEL
BUN: 15 mg/dL (ref 6–23)
Creatinine, Ser: 0.74 mg/dL (ref 0.50–1.35)
GFR calc Af Amer: >90 mL/min (ref >90)
GFR calc non Af Amer: >90 mL/min (ref >90)
Glucose, Bld: 109 mg/dL — ABNORMAL HIGH (ref 70–99)

## 2012-05-19 LAB — CBC
HCT: 29.9 % — ABNORMAL LOW (ref 39.0–52.0)
Hemoglobin: 10.5 g/dL — ABNORMAL LOW (ref 13.0–17.0)
MCH: 32.4 pg (ref 26.0–34.0)
MCHC: 35.1 g/dL (ref 30.0–36.0)
MCV: 92.3 fL (ref 78.0–100.0)
RDW: 12.3 % (ref 11.5–15.5)

## 2012-05-19 MED ORDER — DIPHENHYDRAMINE HCL 25 MG PO CAPS
25.0000 mg | ORAL_CAPSULE | Freq: Four times a day (QID) | ORAL | Status: DC | PRN
  Administered 2012-05-19 – 2012-05-20 (×2): 25 mg via ORAL
  Filled 2012-05-19 (×2): qty 1

## 2012-05-19 NOTE — Progress Notes (Signed)
Physical Therapy Treatment Patient Details Name: Russell Sanford MRN: [id number] DOB: [birth year] Today's Date: 05/19/2012 Time: 5284-1324 PT Time Calculation (min): 29 min  PT Assessment / Plan / Recommendation Comments on Treatment Session  POD # 2 assisted pt back to bed to perform TKR TE's.  Also given handout.  Instructed pt how to use a bed sheet to assist with heel slides. Pt c/o "more pain today than yesterday".  ICE applied    Follow Up Recommendations  Home health PT     Does the patient have the potential to tolerate intense rehabilitation     Barriers to Discharge        Equipment Recommendations  Rolling walker with 5" wheels    Recommendations for Other Services    Frequency 7X/week   Plan Discharge plan remains appropriate    Precautions / Restrictions Precautions Precautions: Knee Precaution Comments: Instructed pt on KI use foe amb Required Braces or Orthoses: Knee Immobilizer - Left Knee Immobilizer - Left: Discontinue once straight leg raise with < 10 degree lag Restrictions Weight Bearing Restrictions: No LLE Weight Bearing: Weight bearing as tolerated   Pertinent Vitals/Pain C/o 8/10 during TE's ICE applied       Exercises Total Joint Exercises Ankle Circles/Pumps: AROM;Both;10 reps;Supine Quad Sets: AROM;Both;10 reps;Supine Gluteal Sets: AROM;Both;10 reps;Supine Towel Squeeze: AROM;Both;10 reps;Supine Short Arc Quad: AAROM;Left;10 reps;Supine Heel Slides: AAROM;Left;10 reps;Supine Hip ABduction/ADduction: AAROM;Left;10 reps;Supine Straight Leg Raises: AAROM;Left;10 reps;Supine    PT Goals    Visit Information  Last PT Received On: 05/19/12 Assistance Needed: +1    Subjective Data      Cognition       Balance     End of Session PT - End of Session Equipment Utilized During Treatment: Gait belt Activity Tolerance: Patient limited by fatigue;Patient limited by pain Patient left: in bed;with call bell/phone within reach;Other  (comment) (ICE to L kbnee and heat to L thigh) CPM Left Knee CPM Left Knee: On   Felecia Shelling  PTA [facility]  Acute  Rehab Pager      [phone]

## 2012-05-19 NOTE — Progress Notes (Signed)
Subjective: 2 Days Post-Op Procedure(s) (LRB): TOTAL KNEE ARTHROPLASTY (Left) Patient reports pain as moderate.  Did not sleep much otherwise doing well.  Objective: Vital signs in last 24 hours: Temp:  [98.5 F (36.9 C)-98.9 F (37.2 C)] 98.6 F (37 C) (02/02 0539) Pulse Rate:  [77-79] 77  (02/02 0539) Resp:  [17-18] 18  (02/02 0800) BP: (113-133)/(62-73) 128/73 mmHg (02/02 0539) SpO2:  [92 %-96 %] 96 % (02/02 0800)  Intake/Output from previous day: 02/01 0701 - 02/02 0700 In: 720 [P.O.:480] Out: 2955 [Urine:2675; Drains:280] Intake/Output this shift:     Basename 05/19/12 0432 05/18/12 0424  HGB 10.5* 12.7*    Basename 05/19/12 0432 05/18/12 0424  WBC 9.5 16.6*  RBC 3.24* 4.02*  HCT 29.9* 36.6*  PLT 149* 202    Basename 05/18/12 0424  NA 131*  K 4.1  CL 97  CO2 24  BUN 14  CREATININE 0.79  GLUCOSE 146*  CALCIUM 8.8   No results found for this basename: LABPT:2,INR:2 in the last 72 hours  Right lower leg: Neurovascular intact Wound benign Calf supple Non tender  Assessment/Plan: 2 Days Post-Op Procedure(s) (LRB): TOTAL KNEE ARTHROPLASTY (Left)  Will check BMET to monitor hyponatremia  Leukocytopenia will monitor Lovenox for DVT prophylaxis ABLA secondary to surgery asymptomatic   OOB with PT KVO IV Adrien Dietzman 05/19/2012, 8:28 AM

## 2012-05-19 NOTE — Progress Notes (Signed)
Physical Therapy Treatment Patient Details Name: Russell Sanford MRN: [id number] DOB: 1956-03-21 Today's Date: 05/19/2012 Time: 6295-2841 PT Time Calculation (min): 25 min  PT Assessment / Plan / Recommendation Comments on Treatment Session  POD # 2 L TKR assisted pt OOB to amb in hallway then positioned in recliner.  Pain level too high to tolerate TE's so applied ICE and plan to return after pain meds.  Pt states his knee is hurting more today than it did yesterday.    Follow Up Recommendations  Home health PT     Does the patient have the potential to tolerate intense rehabilitation     Barriers to Discharge        Equipment Recommendations  Rolling walker with 5" wheels    Recommendations for Other Services    Frequency 7X/week   Plan Discharge plan remains appropriate    Precautions / Restrictions Precautions Precautions: Knee Precaution Comments: Instructed pt on KI use foe amb Required Braces or Orthoses: Knee Immobilizer - Left Knee Immobilizer - Left: Discontinue once straight leg raise with < 10 degree lag Restrictions Weight Bearing Restrictions: No LLE Weight Bearing: Weight bearing as tolerated   Pertinent Vitals/Pain C/o 8/10 L knee pain meds requested and ICE applied    Mobility  Bed Mobility Bed Mobility: Supine to Sit Supine to Sit: 4: Min guard Details for Bed Mobility Assistance: Min Guard assist to support L LE off bed and increased time due to increased c/o pain this am Transfers Transfers: Sit to Stand;Stand to [employer] to Stand: 5: Supervision;From bed Stand to Sit: 5: Supervision;To chair/3-in-1 Details for Transfer Assistance: increased time 2nd increased c/o pain today vs yesterday Ambulation/Gait Ambulation/Gait Assistance: 5: Supervision;4: Min guard Ambulation Distance (Feet): 185 Feet Assistive device: Rolling walker Ambulation/Gait Assistance Details: <25% VC's to increase heel strike and WB thru L LE Gait Pattern: Step-to  pattern;Decreased stance time - left;Trunk flexed Gait velocity: decreased    PT Goals    Visit Information  Last PT Received On: 05/19/12 Assistance Needed: +1    Subjective Data      Cognition       Balance     End of Session PT - End of Session Equipment Utilized During Treatment: Gait belt;Left knee immobilizer Activity Tolerance: Patient limited by pain Patient left: in chair;with call bell/phone within reach;Other (comment) (ICE to L knee)   Felecia Shelling  PTA [facility]  Acute  Rehab Pager      [phone]

## 2012-05-19 NOTE — Progress Notes (Signed)
Physical Therapy Treatment Patient Details Name: Russell Sanford MRN: [id number] DOB: 31-Oct-1955 Today's Date: 05/19/2012 Time: 4098-1191 PT Time Calculation (min): 24 min  PT Assessment / Plan / Recommendation Comments on Treatment Session  POD # 2 pm session.  Assisted pt OOB to amb in hallway 2nd time then assisted back to bed for CPM.    Follow Up Recommendations  Home health PT     Does the patient have the potential to tolerate intense rehabilitation     Barriers to Discharge        Equipment Recommendations  Rolling walker with 5" wheels    Recommendations for Other Services    Frequency 7X/week   Plan Discharge plan remains appropriate    Precautions / Restrictions Precautions Precautions: Knee Precaution Comments: Instructed pt on KI use foe amb Required Braces or Orthoses: Knee Immobilizer - Left Knee Immobilizer - Left: Discontinue once straight leg raise with < 10 degree lag Restrictions Weight Bearing Restrictions: No LLE Weight Bearing: Weight bearing as tolerated   Pertinent Vitals/Pain C/o 8/10 after amb ICE applied Pre medicated    Mobility  Bed Mobility Bed Mobility: Supine to Sit;Sit to Supine Supine to Sit: 4: Min guard Sit to Supine: 4: Min guard Details for Bed Mobility Assistance: Assisted OOB to amb then back to bed for CPM Transfers Transfers: Sit to Stand;Stand to Sit Sit to Stand: 5: Supervision;From toilet Stand to Sit: 5: Supervision;To bed Details for Transfer Assistance: One VC to extend L LE prior to stand and sit to decrease pain Ambulation/Gait Ambulation/Gait Assistance: 5: Supervision Ambulation Distance (Feet): 194 Feet Assistive device: Rolling walker Ambulation/Gait Assistance Details: <25% VC's to increase heel strike and WB thru L LE and to decrease gait speed for increase safety Gait Pattern: Step-to pattern;Decreased stance time - left Gait velocity: decreased         PT Goals     Visit Information  Last PT  Received On: 05/19/12 Assistance Needed: +1    Subjective Data      Cognition       Balance     End of Session PT - End of Session Equipment Utilized During Treatment: Gait belt Activity Tolerance: Patient limited by fatigue;Patient limited by pain Patient left: in bed;with call bell/phone within reach;with family/visitor present CPM Left Knee CPM Left Knee: On   Felecia Shelling  PTA [facility]  Acute  Rehab Pager      [phone]

## 2012-05-20 ENCOUNTER — Encounter (HOSPITAL_COMMUNITY): Payer: Self-pay | Admitting: Specialist

## 2012-05-20 LAB — CBC
HCT: 29.5 % — ABNORMAL LOW (ref 39.0–52.0)
Hemoglobin: 10.2 g/dL — ABNORMAL LOW (ref 13.0–17.0)
MCH: 31.9 pg (ref 26.0–34.0)
MCHC: 34.6 g/dL (ref 30.0–36.0)
RDW: 12.3 % (ref 11.5–15.5)

## 2012-05-20 MED ORDER — DSS 100 MG PO CAPS: 100.0000 mg | ORAL_CAPSULE | Freq: Two times a day (BID) | ORAL | Status: DC

## 2012-05-20 MED ORDER — ASPIRIN EC 325 MG PO TBEC: 325.0000 mg | DELAYED_RELEASE_TABLET | Freq: Two times a day (BID) | ORAL | Status: DC

## 2012-05-20 MED ORDER — METHOCARBAMOL 500 MG PO TABS: 500.0000 mg | ORAL_TABLET | Freq: Four times a day (QID) | ORAL | Status: DC | PRN

## 2012-05-20 MED ORDER — HYDROMORPHONE HCL 4 MG PO TABS
4.0000 mg | ORAL_TABLET | ORAL | Status: DC | PRN
Start: 2012-05-20 — End: 2012-05-29

## 2012-05-20 MED ORDER — FERROUS SULFATE 325 (65 FE) MG PO TABS: 325.0000 mg | ORAL_TABLET | Freq: Three times a day (TID) | ORAL | Status: DC

## 2012-05-20 NOTE — Care Management Note (Signed)
    Page 1 of 2   05/20/2012     11:11:56 AM   CARE MANAGEMENT NOTE 05/20/2012  Patient:  Russell Sanford, Russell Sanford   Account Number:  [id number]  Date Initiated:  05/20/2012  Documentation initiated by:  Colleen Can  Subjective/Objective Assessment:   dx left knee osteoarthritis; total knee replacemnt.  Per-arranged with Genevieve Norlander prior to admission. Genevieve Norlander will arrange for CPM thru medical modalities     Action/Plan:   CM spoke with patient. Plans are for him to return to his home in [place] where spouse will be caregiver. States he will need RW and CPM.   Anticipated DC Date:  05/20/2012   Anticipated DC Plan:  HOME W HOME HEALTH SERVICES  In-house referral  NA      DC Planning Services  CM consult      PAC Choice  DURABLE MEDICAL EQUIPMENT  HOME HEALTH   Choice offered to / List presented to:  C-1 Patient   DME arranged  WALKER - ROLLING  OTHER - SEE COMMENT      DME agency  [facility]  MEDICAL MODALITIES     HH arranged  HH-2 PT      [facility] agency  [facility]   Status of service:  Completed, signed off Medicare Important Message given?  NO (If response is "NO", the following Medicare IM given date fields will be blank) Date Medicare IM given:   Date Additional Medicare IM given:    Discharge Disposition:  HOME W HOME HEALTH SERVICES  Per UR Regulation:    If discussed at Long Length of Stay Meetings, dates discussed:    Comments:  05/19/2012 Colleen Can BSN RN CCM [phone] Genevieve Norlander will provide [facility] services with start date 05/20/2012. RW has been delivered to patient's room.

## 2012-05-20 NOTE — Progress Notes (Signed)
Subjective: Patient denies any specific complaints. He does feel like the pain medicine is working but doesn't feel it strong enough at this point. No problems with nausea vomiting is tolerating food well   Objective: Vital signs in last 24 hours: Temp:  [98 F (36.7 C)-98.4 F (36.9 C)] 98.4 F (36.9 C) (02/03 0439) Pulse Rate:  [76-90] 76  (02/03 0439) Resp:  [17-18] 18  (02/03 0439) BP: (114-121)/(63-75) 114/63 mmHg (02/03 0439) SpO2:  [94 %-96 %] 95 % (02/03 0439)  Intake/Output from previous day: 02/02 0701 - 02/03 0700 In: 1983.5 [P.O.:1320; I.V.:663.5] Out: 3620 [Urine:3600; Drains:20] Intake/Output this shift:     Basename 05/20/12 0419 05/19/12 0432 05/18/12 0424  HGB 10.2* 10.5* 12.7*    Basename 05/20/12 0419 05/19/12 0432  WBC 8.0 9.5  RBC 3.20* 3.24*  HCT 29.5* 29.9*  PLT 139* 149*    Basename 05/19/12 0903 05/18/12 0424  NA 132* 131*  K 3.5 4.1  CL 96 97  CO2 27 24  BUN 15 14  CREATININE 0.74 0.79  GLUCOSE 109* 146*  CALCIUM 8.8 8.8   No results found for this basename: LABPT:2,INR:2 in the last 72 hours  Patient is conscious alert and appropriate sitting up in bed appears to be in no extreme distress. His left main wound is well approximated Steri-Strips no signs of drainage no signs of infection no pressure blisters is a quad region where the tourniquet was as little sore but it's not tense his calf is soft and nontender his leg is neuromotor vascularly intact  Assessment/Plan: Postop day #3 status post left total knee arthroplasty doing well Mild postoperative hyponatremia improving asymptomatic we'll allow to self correct Expected postoperative blood loss asymptomatic we'll allow to self correct with by mouth supplementation GERD well controlled  Plan out of bed with physical therapy this morning we'll discharge to home with home health physical therapy 2 week followup prescriptions will be provided   Russell Sanford,Russell Sanford 05/20/2012, 7:04 AM

## 2012-05-20 NOTE — Discharge Summary (Signed)
Physician Discharge Summary  Patient ID: Russell Sanford MRN: [id number] DOB/AGE: [birth year] 57 y.o.  Admit date: 05/17/2012 Discharge date: 05/20/2012  Admission Diagnoses: End-stage osteoarthritis left knee Hyperlipidemia GERD History of Barrett's esophagitis  Discharge Diagnoses: Left total knee arthroplasty without complications Hyperlipidemia stable GERD stable History of Barrett's esophagitis stable   Discharged Condition: good  Hospital Course: Patient was admitted [facility] in the care Dr. Valma Cava patient was taken to the OR whirlpool left total knee arthroplasty was performed without complications under spinal anesthesia patient was transferred to recovery room with a Hemovac drain in place to follow a total knee arthroplasty protocol. Patient had no untoward events during his hospitalization over his 3 days. His vital signs remained stable. He did develop some mild hyponatremia remained asymptomatic. His Hemovac drain was DC'd on postop day #2. His dressing was taken down his wound was benign for any signs of infection his leg is neuromotor vascularly intact throughout his hospitalization. He continued with IV hydration until postop day #3 when it was DC'd his vital signs were stable he was tolerating his medications well except for he didn't think pain medicine was white covering his discomfort will adjust his pain medicines. Patient was felt to be medically and orthopedically stable ready for discharge home is arrangements are made for home health physical therapy and followup and prescriptions provided  Consults: None  Significant Diagnostic Studies: Routine postop total knee arthroplasty labs  Treatments: Routine total knee arthroplasty protocol  Discharge Exam: Blood pressure 114/63, pulse 76, temperature 98.4 F (36.9 C), temperature source Oral, resp. rate 18, height 6' (1.829 m), weight 125.646 kg (277 lb), SpO2 95.00%. Patient is conscious alert  appropriate appears to be quite comfortable in no extreme distress. His left knee wound is well approximated with Steri-Strips no signs of infection or drainage no pressure blisters. His thigh is a little sore from the tourniquet but it is not tense his calf is soft and nontender his leg is neuromotor vascularly intact  Disposition: Discharge to home with home health physical therapy and a 2 week followup appointment  Discharge Orders    Future Orders Please Complete By Expires   Diet general      Call MD / Call 911      Comments:   If you experience chest pain or shortness of breath, CALL 911 and be transported to the hospital emergency room.  If you develope a fever above 101 F, pus (white drainage) or increased drainage or redness at the wound, or calf pain, call your surgeon's office.   Increase activity slowly as tolerated      Discharge instructions      Comments:   Call [phone] for followup appointment with Dr. Thomasena Edis is clinic in 2 weeks   CPM      Comments:   Continuous passive motion machine (CPM):      Use the CPM from 0 to 50 for 6 hours per day.      You may increase by 10 per day.  You may break it up into 2 or 3 sessions per day.      Use CPM for 2 weeks or until you are told to stop.   TED hose      Comments:   Use stockings (TED hose) for 3 weeks on both leg(s).  You may remove them at night for sleeping.   Change dressing      Comments:   Change dressing daily with sterile 4 x  4 inch gauze dressing and apply TED hose.  You may clean the incision with alcohol prior to redressing.   Do not put a pillow under the knee. Place it under the heel.          Medication List     As of 05/20/2012  7:13 AM    STOP taking these medications         traMADol 50 MG tablet   Commonly known as: ULTRAM      TAKE these medications         aspirin EC 325 MG tablet   Take 1 tablet (325 mg total) by mouth 2 (two) times daily.      celecoxib 200 MG capsule   Commonly known as:  CELEBREX   Take 200 mg by mouth 2 (two) times daily.      CRESTOR 10 MG tablet   Generic drug: rosuvastatin   take 1 tablet by mouth once daily      DSS 100 MG Caps   Take 100 mg by mouth 2 (two) times daily.      ferrous sulfate 325 (65 FE) MG tablet   Take 1 tablet (325 mg total) by mouth 3 (three) times daily after meals.      HYDROmorphone 4 MG tablet   Commonly known as: DILAUDID   Take 1-1.5 tablets (4-6 mg total) by mouth every 4 (four) hours as needed for pain.      methocarbamol 500 MG tablet   Commonly known as: ROBAXIN   Take 1 tablet (500 mg total) by mouth every 6 (six) hours as needed.      omeprazole 20 MG tablet   Commonly known as: PRILOSEC OTC   Take 20 mg by mouth daily.         SignedJamelle Rushing 05/20/2012, 7:13 AM

## 2012-05-20 NOTE — Progress Notes (Signed)
Physical Therapy Treatment Patient Details Name: Russell Sanford MRN: [id number] DOB: 1955-06-19 Today's Date: 05/20/2012 Time: 4098-1191 PT Time Calculation (min): 38 min  PT Assessment / Plan / Recommendation Comments on Treatment Session  POD # 3 L TKR pt plans to D/C to home today.  practiced steps and perfromed all supine TE's. Instructed pt on use of ICE.  Instructed pt on proper car transfer.     Follow Up Recommendations  Home health PT     Does the patient have the potential to tolerate intense rehabilitation     Barriers to Discharge        Equipment Recommendations  Rolling walker with 5" wheels    Recommendations for Other Services    Frequency 7X/week   Plan Discharge plan remains appropriate    Precautions / Restrictions Precautions Precautions: Knee Precaution Comments: Pt able to perform 10 active SLR so instructed on D/C KI Knee Immobilizer - Left: Discontinue once straight leg raise with < 10 degree lag Restrictions Weight Bearing Restrictions: No LLE Weight Bearing: Weight bearing as tolerated   Pertinent Vitals/Pain C/o "swelling" ICE applied    Mobility  Bed Mobility Bed Mobility: Supine to Sit Supine to Sit: 6: Modified independent (Device/Increase time) Details for Bed Mobility Assistance: increased time Transfers Transfers: Sit to Stand;Stand to Sit Sit to Stand: 6: Modified independent (Device/Increase time);From bed;From chair/3-in-1 Stand to Sit: 6: Modified independent (Device/Increase time);To chair/3-in-1 Details for Transfer Assistance: increased time Ambulation/Gait Ambulation/Gait Assistance: 6: Modified independent (Device/Increase time) Ambulation Distance (Feet): 248 Feet Assistive device: Rolling walker Ambulation/Gait Assistance Details: No KI and increased time with one VC on sequencing with backward gait. Gait Pattern: Step-to pattern;Decreased stance time - left Gait velocity: decreased Stairs: Yes Stairs Assistance: 5:  Supervision Stairs Assistance Details (indicate cue type and reason): 25% VC's on proper sequencing, safety and crutch placement. Stair Management Technique: One rail Right;With crutches;Forwards Number of Stairs: 4     Exercises Total Joint Exercises Ankle Circles/Pumps: AROM;10 reps;Supine;Both Quad Sets: AROM;10 reps;Supine;Both Gluteal Sets: AROM;10 reps;Supine;Both Towel Squeeze: AROM;10 reps;Supine;Both Short Arc Quad: AROM;Left;10 reps;Supine Heel Slides: AAROM;Left;10 reps;Supine (using bed sheet) Hip ABduction/ADduction: AROM;Left;10 reps;Supine Straight Leg Raises: AROM;Left;10 reps;Supine    PT Goals    Visit Information  Last PT Received On: 05/20/12 Assistance Needed: +1    Subjective Data      Cognition       Balance     End of Session PT - End of Session Equipment Utilized During Treatment: Gait belt Activity Tolerance: Patient tolerated treatment well Patient left: in bed;with call bell/phone within reach (ICE to L knee)   Felecia Shelling  PTA [facility]  Acute  Rehab Pager      [phone]

## 2012-05-29 ENCOUNTER — Emergency Department (HOSPITAL_COMMUNITY): Payer: BC Managed Care – PPO

## 2012-05-29 ENCOUNTER — Inpatient Hospital Stay (HOSPITAL_COMMUNITY)
Admission: EM | Admit: 2012-05-29 | Discharge: 2012-05-31 | DRG: 541 | Disposition: A | Discharge status: Home Health | Payer: BC Managed Care – PPO | Attending: Internal Medicine | Admitting: Internal Medicine

## 2012-05-29 ENCOUNTER — Encounter (HOSPITAL_COMMUNITY): Payer: Self-pay

## 2012-05-29 DIAGNOSIS — S98119A Complete traumatic amputation of unspecified great toe, initial encounter: Secondary | ICD-10-CM

## 2012-05-29 DIAGNOSIS — M1990 Unspecified osteoarthritis, unspecified site: Secondary | ICD-10-CM

## 2012-05-29 DIAGNOSIS — Z96659 Presence of unspecified artificial knee joint: Secondary | ICD-10-CM

## 2012-05-29 DIAGNOSIS — Z86711 Personal history of pulmonary embolism: Secondary | ICD-10-CM | POA: Diagnosis present

## 2012-05-29 DIAGNOSIS — Y831 Surgical operation with implant of artificial internal device as the cause of abnormal reaction of the patient, or of later complication, without mention of misadventure at the time of the procedure: Secondary | ICD-10-CM | POA: Diagnosis present

## 2012-05-29 DIAGNOSIS — E785 Hyperlipidemia, unspecified: Secondary | ICD-10-CM

## 2012-05-29 DIAGNOSIS — Z882 Allergy status to sulfonamides status: Secondary | ICD-10-CM

## 2012-05-29 DIAGNOSIS — K219 Gastro-esophageal reflux disease without esophagitis: Secondary | ICD-10-CM | POA: Diagnosis present

## 2012-05-29 DIAGNOSIS — Z79899 Other long term (current) drug therapy: Secondary | ICD-10-CM

## 2012-05-29 DIAGNOSIS — T81718A Complication of other artery following a procedure, not elsewhere classified, initial encounter: Principal | ICD-10-CM | POA: Diagnosis present

## 2012-05-29 DIAGNOSIS — I82409 Acute embolism and thrombosis of unspecified deep veins of unspecified lower extremity: Secondary | ICD-10-CM

## 2012-05-29 DIAGNOSIS — Z86718 Personal history of other venous thrombosis and embolism: Secondary | ICD-10-CM | POA: Diagnosis present

## 2012-05-29 DIAGNOSIS — I824Z9 Acute embolism and thrombosis of unspecified deep veins of unspecified distal lower extremity: Secondary | ICD-10-CM | POA: Diagnosis present

## 2012-05-29 DIAGNOSIS — Y92009 Unspecified place in unspecified non-institutional (private) residence as the place of occurrence of the external cause: Secondary | ICD-10-CM

## 2012-05-29 DIAGNOSIS — K449 Diaphragmatic hernia without obstruction or gangrene: Secondary | ICD-10-CM | POA: Diagnosis present

## 2012-05-29 DIAGNOSIS — M129 Arthropathy, unspecified: Secondary | ICD-10-CM | POA: Diagnosis present

## 2012-05-29 DIAGNOSIS — Z7982 Long term (current) use of aspirin: Secondary | ICD-10-CM

## 2012-05-29 DIAGNOSIS — Z96651 Presence of right artificial knee joint: Secondary | ICD-10-CM

## 2012-05-29 DIAGNOSIS — I999 Unspecified disorder of circulatory system: Secondary | ICD-10-CM | POA: Diagnosis present

## 2012-05-29 DIAGNOSIS — I2699 Other pulmonary embolism without acute cor pulmonale: Principal | ICD-10-CM

## 2012-05-29 LAB — CBC
HCT: 34 % — ABNORMAL LOW (ref 39.0–52.0)
Hemoglobin: 11.6 g/dL — ABNORMAL LOW (ref 13.0–17.0)
MCHC: 34.1 g/dL (ref 30.0–36.0)
RBC: 3.66 MIL/uL — ABNORMAL LOW (ref 4.22–5.81)

## 2012-05-29 LAB — COMPREHENSIVE METABOLIC PANEL
ALT: 25 U/L (ref 0–53)
AST: 25 U/L (ref 0–37)
Albumin: 3.7 g/dL (ref 3.5–5.2)
Alkaline Phosphatase: 95 U/L (ref 39–117)
BUN: 10 mg/dL (ref 6–23)
Chloride: 102 mEq/L (ref 96–112)
Potassium: 3.8 mEq/L (ref 3.5–5.1)
Total Bilirubin: 1.2 mg/dL (ref 0.3–1.2)

## 2012-05-29 LAB — CBC WITH DIFFERENTIAL/PLATELET
Basophils Relative: 0 % (ref 0–1)
Hemoglobin: 11.8 g/dL — ABNORMAL LOW (ref 13.0–17.0)
MCHC: 34.4 g/dL (ref 30.0–36.0)
Monocytes Relative: 9 % (ref 3–12)
Neutro Abs: 6.4 10*3/uL (ref 1.7–7.7)
Neutrophils Relative %: 80 % — ABNORMAL HIGH (ref 43–77)
RBC: 3.72 MIL/uL — ABNORMAL LOW (ref 4.22–5.81)

## 2012-05-29 LAB — APTT: aPTT: 33 seconds (ref 24–37)

## 2012-05-29 LAB — POCT I-STAT TROPONIN I: Troponin i, poc: 0 ng/mL (ref 0.00–0.08)

## 2012-05-29 MED ORDER — HYDROMORPHONE HCL 2 MG PO TABS
8.0000 mg | ORAL_TABLET | Freq: Four times a day (QID) | ORAL | Status: DC | PRN
  Administered 2012-05-29 – 2012-05-30 (×4): 8 mg via ORAL
  Filled 2012-05-29 (×5): qty 4

## 2012-05-29 MED ORDER — MORPHINE SULFATE 4 MG/ML IJ SOLN
INTRAMUSCULAR | Status: AC
  Administered 2012-05-29: 4 mg
  Filled 2012-05-29: qty 1

## 2012-05-29 MED ORDER — SODIUM CHLORIDE 0.9 % IJ SOLN
3.0000 mL | Freq: Two times a day (BID) | INTRAMUSCULAR | Status: DC
  Administered 2012-05-29 – 2012-05-30 (×3): 3 mL via INTRAVENOUS

## 2012-05-29 MED ORDER — ACETAMINOPHEN 325 MG PO TABS: 650.0000 mg | ORAL_TABLET | Freq: Four times a day (QID) | ORAL | Status: DC | PRN

## 2012-05-29 MED ORDER — ONDANSETRON HCL 4 MG/2ML IJ SOLN: 4.0000 mg | Freq: Four times a day (QID) | INTRAMUSCULAR | Status: DC | PRN

## 2012-05-29 MED ORDER — ZOLPIDEM TARTRATE 5 MG PO TABS
5.0000 mg | ORAL_TABLET | Freq: Every evening | ORAL | Status: DC | PRN
  Administered 2012-05-29: 5 mg via ORAL
  Filled 2012-05-29: qty 1

## 2012-05-29 MED ORDER — ENOXAPARIN (LOVENOX) PATIENT EDUCATION KIT
PACK | Freq: Once | Status: AC
  Administered 2012-05-29: 18:00:00
  Filled 2012-05-29: qty 1

## 2012-05-29 MED ORDER — HYDROMORPHONE HCL PF 1 MG/ML IJ SOLN
1.0000 mg | INTRAMUSCULAR | Status: DC | PRN
  Administered 2012-05-29 – 2012-05-30 (×4): 1 mg via INTRAVENOUS
  Filled 2012-05-29 (×5): qty 1

## 2012-05-29 MED ORDER — TRAMADOL HCL 50 MG PO TABS: 50.0000 mg | ORAL_TABLET | Freq: Four times a day (QID) | ORAL | Status: DC | PRN

## 2012-05-29 MED ORDER — IOHEXOL 350 MG/ML SOLN
100.0000 mL | Freq: Once | INTRAVENOUS | Status: AC | PRN
  Administered 2012-05-29: 100 mL via INTRAVENOUS

## 2012-05-29 MED ORDER — SODIUM CHLORIDE 0.9 % IV SOLN
INTRAVENOUS | Status: DC
  Administered 2012-05-29: 12:00:00 via INTRAVENOUS

## 2012-05-29 MED ORDER — ENOXAPARIN SODIUM 120 MG/0.8ML ~~LOC~~ SOLN
120.0000 mg | Freq: Two times a day (BID) | SUBCUTANEOUS | Status: DC
  Administered 2012-05-29 – 2012-05-31 (×4): 120 mg via SUBCUTANEOUS
  Filled 2012-05-29 (×6): qty 0.8

## 2012-05-29 MED ORDER — METHOCARBAMOL 500 MG PO TABS
500.0000 mg | ORAL_TABLET | Freq: Four times a day (QID) | ORAL | Status: DC | PRN
  Administered 2012-05-29 – 2012-05-30 (×4): 500 mg via ORAL
  Filled 2012-05-29 (×4): qty 1

## 2012-05-29 MED ORDER — WARFARIN - PHARMACIST DOSING INPATIENT: Freq: Every day | Status: DC

## 2012-05-29 MED ORDER — PANTOPRAZOLE SODIUM 40 MG PO TBEC
40.0000 mg | DELAYED_RELEASE_TABLET | Freq: Every day | ORAL | Status: DC
  Administered 2012-05-30 – 2012-05-31 (×2): 40 mg via ORAL
  Filled 2012-05-29 (×2): qty 1

## 2012-05-29 MED ORDER — WARFARIN SODIUM 10 MG PO TABS
10.0000 mg | ORAL_TABLET | Freq: Once | ORAL | Status: AC
  Administered 2012-05-29: 10 mg via ORAL
  Filled 2012-05-29: qty 1

## 2012-05-29 MED ORDER — ATORVASTATIN CALCIUM 20 MG PO TABS
20.0000 mg | ORAL_TABLET | Freq: Every day | ORAL | Status: DC
  Administered 2012-05-30: 20 mg via ORAL
  Filled 2012-05-29 (×4): qty 1

## 2012-05-29 MED ORDER — MORPHINE SULFATE 4 MG/ML IJ SOLN
4.0000 mg | Freq: Once | INTRAMUSCULAR | Status: AC
  Administered 2012-05-29: 4 mg via INTRAVENOUS

## 2012-05-29 MED ORDER — ACETAMINOPHEN 650 MG RE SUPP: 650.0000 mg | Freq: Four times a day (QID) | RECTAL | Status: DC | PRN

## 2012-05-29 MED ORDER — ONDANSETRON HCL 4 MG PO TABS: 4.0000 mg | ORAL_TABLET | Freq: Four times a day (QID) | ORAL | Status: DC | PRN

## 2012-05-29 MED ORDER — OMEPRAZOLE MAGNESIUM 20 MG PO TBEC: 40.0000 mg | DELAYED_RELEASE_TABLET | Freq: Every morning | ORAL | Status: DC

## 2012-05-29 MED ORDER — HEPARIN BOLUS VIA INFUSION
5000.0000 [IU] | Freq: Once | INTRAVENOUS | Status: AC
  Administered 2012-05-29: 5000 [IU] via INTRAVENOUS

## 2012-05-29 MED ORDER — HEPARIN (PORCINE) IN NACL 100-0.45 UNIT/ML-% IJ SOLN
1600.0000 [IU]/h | INTRAMUSCULAR | Status: DC
  Administered 2012-05-29: 1600 [IU]/h via INTRAVENOUS
  Filled 2012-05-29: qty 250

## 2012-05-29 NOTE — H&P (Addendum)
[facility] History and Physical  Russell Sanford OZH:[id number] DOB: Mar 22, 1956 DOA: 05/29/2012  Referring physician: EDP PCP: Carollee Herter, MD    Chief Complaint: shortness of breath  HPI: Russell Sanford is a 57 y.o. male was status post left knee total arthroplasty on 1/31 per Dr. Thomasena Edis for osteoarthritis, was discharged home on 2/3 and presents to the ER with the above complaint. Patient reports that for the last week he's noticed dyspnea with mild to moderate activity i.e. walking within the house itself and physical therapy, he attributed this to pain medications and deconditioning after surgery. This morning woke up acutely short of breath in bed, couldn't catch his breath at all and called EMS and was brought to [facility] long ER. Upon evaluation in the ER noted to have bilateral pulmonary emboli with moderate clot burden.  Review of Systems: The patient denies anorexia, fever, weight loss,, vision loss, decreased hearing, hoarseness, chest pain, syncope, dyspnea on exertion, peripheral edema, balance deficits, hemoptysis, abdominal pain, melena, hematochezia, severe indigestion/heartburn, hematuria, incontinence, genital sores, muscle weakness, suspicious skin lesions, transient blindness, difficulty walking, depression, unusual weight change, abnormal bleeding, enlarged lymph nodes, angioedema, and breast masses.    Past Medical History  Diagnosis Date  . HH (hiatus hernia)   . Barrett's esophagus   . Arthritis   . GERD (gastroesophageal reflux disease)   . Hypercholesteremia    Past Surgical History  Procedure Laterality Date  . Knee arthroscopy      bilateral  . Shoulder arthroscopy      bilateral  . Toe amputation      left big toe  . Fracture surgery      left lower leg  . Lasik    . Total knee arthroplasty  05/17/2012    Procedure: TOTAL KNEE ARTHROPLASTY;  Surgeon: Eugenia Mcalpine, MD;  Location: [facility] ORS;  Service: Orthopedics;  Laterality: Left;    Social History:  reports that he has never smoked. He has never used smokeless tobacco. He reports that  drinks alcohol. He reports that he does not use illicit drugs. Lives at home with wife  Allergies  Allergen Reactions  . Sulfonamide Derivatives Rash    Family history. Remarkable for heart disease in his father, no history of VTE  Prior to Admission medications   Medication Sig Start Date End Date Taking? Authorizing Provider  aspirin EC 325 MG tablet Take 325 mg by mouth 2 (two) times daily. 05/20/12  Yes Jamelle Rushing, PA  celecoxib (CELEBREX) 200 MG capsule Take 200 mg by mouth 2 (two) times daily.   Yes Historical Provider, MD  Docusate Sodium (DSS) 100 MG CAPS Take 100 mg by mouth 2 (two) times daily. 05/20/12  Yes Jamelle Rushing, PA  ferrous sulfate 325 (65 FE) MG tablet Take 325 mg by mouth 3 (three) times daily after meals. 05/20/12  Yes Jamelle Rushing, PA  HYDROmorphone (DILAUDID) 4 MG tablet Take 8 mg by mouth every 4 (four) hours as needed for pain. 05/20/12  Yes Jamelle Rushing, PA  methocarbamol (ROBAXIN) 500 MG tablet Take 500 mg by mouth every 6 (six) hours as needed (For muscle spasms.). 05/20/12  Yes Jamelle Rushing, PA  omeprazole (PRILOSEC OTC) 20 MG tablet Take 20 mg by mouth every morning.    Yes Historical Provider, MD  rosuvastatin (CRESTOR) 10 MG tablet Take 10 mg by mouth at bedtime.   Yes Historical Provider, MD  traMADol (ULTRAM) 50 MG tablet Take 50 mg by mouth  every 6 (six) hours as needed for pain.   Yes Historical Provider, MD   Physical Exam: Filed Vitals:   05/29/12 1036 05/29/12 1220 05/29/12 1527  BP: 116/103 132/74 124/71  Pulse: 80 75 70  Temp: 98.6 F (37 C) 98.4 F (36.9 C) 98.1 F (36.7 C)  TempSrc: Oral  Oral  Resp: 20 16   Height:  6' 0.05" (1.83 m) 6' (1.829 m)  Weight:  125.6 kg (276 lb 14.4 oz) 122.8 kg (270 lb 11.6 oz)  SpO2:  98% 99%     General:  AAOx3, no distress  HEENT: PERRLA, EOMI, no JVD  Cardiovascular: S1S2/RRR, no  m/r/g  Respiratory: CTAB  Abdomen: soft, NT,BS present  Skin: no rashes or skin breakdown  Musculoskeletal: L knee with incision healing well  Psychiatric:appropriate mood and affect  Neurologic: non focal  Labs on Admission:  Basic Metabolic Panel:  Recent Labs Lab 05/29/12 1046  NA 137  K 3.8  CL 102  CO2 24  GLUCOSE 106*  BUN 10  CREATININE 0.78  CALCIUM 9.1   Liver Function Tests:  Recent Labs Lab 05/29/12 1046  AST 25  ALT 25  ALKPHOS 95  BILITOT 1.2  PROT 7.1  ALBUMIN 3.7   No results found for this basename: LIPASE, AMYLASE,  in the last 168 hours No results found for this basename: AMMONIA,  in the last 168 hours CBC:  Recent Labs Lab 05/29/12 1046  WBC 8.0  NEUTROABS 6.4  HGB 11.8*  HCT 34.3*  MCV 92.2  PLT 323   Cardiac Enzymes: No results found for this basename: CKTOTAL, CKMB, CKMBINDEX, TROPONINI,  in the last 168 hours  BNP (last 3 results) No results found for this basename: PROBNP,  in the last 8760 hours CBG: No results found for this basename: GLUCAP,  in the last 168 hours  Radiological Exams on Admission: Dg Chest 2 View  05/29/2012  *RADIOLOGY REPORT*  Clinical Data: Chest pain, shortness of breath, recent knee surgery  CHEST - 2 VIEW  Comparison: 05/13/2009  Findings: Mild enlargement of cardiac silhouette. Tortuous aorta. Pulmonary vascularity questionably slightly congested. No acute infiltrate, pleural effusion or pneumothorax. Bones unremarkable.  IMPRESSION: Mild enlargement of cardiac silhouette with question slight pulmonary vascular congestion. No acute infiltrate.   Original Report Authenticated By: Ulyses Southward, M.D.    Ct Angio Chest Pe W/cm &/or Wo Cm  05/29/2012  *RADIOLOGY REPORT*  Clinical Data: Chest pain and shortness of breath.  CT ANGIOGRAPHY CHEST  Technique:  Multidetector CT imaging of the chest using the standard protocol during bolus administration of intravenous contrast. Multiplanar reconstructed images  including MIPs were obtained and reviewed to evaluate the vascular anatomy.  Contrast: OMNIPAQUE IOHEXOL 350 MG/ML SOLN  Comparison: No priors.  Findings:  Mediastinum: There are numerous filling defects throughout the pulmonary arterial system of the lungs bilaterally, compatible with a large volume of pulmonary emboli.  The largest of these emboli is within the distal aspect of the right main pulmonary artery, however, multiple emboli are noted within the lobar, segmental and subsegmental sized branches to the lungs bilaterally, with a slightly larger clot burden in the right lung when compared with the left.  At this time, there is no dilatation of the pulmonic trunk to suggest pulmonary arterial hypertension, and no overt imaging findings to strongly suggest the presence of right-sided heart strain.  The heart is mildly enlarged. There is atherosclerosis of the thoracic aorta, the great vessels of the mediastinum and  the coronary arteries, including calcified atherosclerotic plaque in the left main, left anterior descending and right coronary arteries. No pathologically enlarged mediastinal or hilar lymph nodes. Esophagus is unremarkable in appearance.  Lungs/Pleura: No acute consolidative airspace disease.  No pleural effusions.  Image 168 of series 11 demonstrates a 1.2 x 1.9 cm nodular density in the right lung associated with the right major fissure.  No other suspicious appearing pulmonary nodules or masses are identified.  Upper Abdomen: Unremarkable.  Musculoskeletal: There are no aggressive appearing lytic or blastic lesions noted in the visualized portions of the skeleton.  IMPRESSION: 1.  Study is positive for a large burden of pulmonary emboli to the lungs bilaterally, as above.  At this time, there is no evidence of pulmonary hemorrhage from pulmonary infarction, and no findings to suggest pulmonary arterial hypertension or right-sided heart strain. 2.  1.2 x 1.9 cm nodular density in the right  lung associated within the right major fissure.  It is uncertain whether not this is an enlarged lymph node, or an actual pulmonary nodule.  At this time, short-term follow up contrast enhanced chest CT in 2-3 months is recommended to reevaluate this finding. 3. Atherosclerosis, including left main and two-vessel coronary artery disease. Please note that although the presence of coronary artery calcium documents the presence of coronary artery disease, the severity of this disease and any potential stenosis cannot be assessed on this non-gated CT examination.  Assessment for potential risk factor modification, dietary therapy or pharmacologic therapy may be warranted, if clinically indicated.  These results were called by telephone on 05/29/2012 at 12:22 p.m. to Dr. Freida Busman, who verbally acknowledged these results.   Original Report Authenticated By: Trudie Reed, M.D.     EKG: Independently reviewed. No acute ST-T wave changes and no evidence of R hear strain pattern  Assessment/Plan  1. Bilateral pulmonary emboli: Moderate clot burden, no evidence of R heart strain on EKG Hemodynamically stable Will anticoagulate with Lovenox and Coumadin per pharmacy, will need to continue anti-coagulation for 3-6 months Lovenox teaching Risk factors: Knee surgery  2.  HYPERLIPIDEMIA: Continue statin  3.  GERD: Continue PPI  4.  S/P total knee arthroplasty: Followup with Dr. Thomasena Edis in one week   Code Status: FULL Family Communication: d/w pt and family at bedside Disposition Plan: home in 2-3 days   Time spent:  [facility] Pager [phone]  If 7PM-7AM, please contact night-coverage [email or web address] Password [facility] 05/29/2012, 3:59 PM

## 2012-05-29 NOTE — ED Provider Notes (Signed)
History     CSN: [id number]  Arrival date & time 05/29/12  1008   First MD Initiated Contact with Patient 05/29/12 1009      Chief Complaint  Patient presents with  . Shortness of Breath    (Consider location/radiation/quality/duration/timing/severity/associated sxs/prior treatment) Patient is a 57 y.o. male presenting with shortness of breath. The history is provided by the patient.  Shortness of Breath  patient here complaining of shortness of breath started acutely this morning when he woke up. Patient thought he could not catch his breath. Called EMS and was given oxygen and feels better. Patient has a recent history of left knee surgery and is on aspirin as anticoagulant. Denies any chest pain at chest pressure. No diaphoresis. No prior history of same.  Past Medical History  Diagnosis Date  . HH (hiatus hernia)   . Barrett's esophagus   . Arthritis   . GERD (gastroesophageal reflux disease)   . Hypercholesteremia     Past Surgical History  Procedure Laterality Date  . Knee arthroscopy      bilateral  . Shoulder arthroscopy      bilateral  . Toe amputation      left big toe  . Fracture surgery      left lower leg  . Lasik    . Total knee arthroplasty  05/17/2012    Procedure: TOTAL KNEE ARTHROPLASTY;  Surgeon: Eugenia Mcalpine, MD;  Location: [facility];  Service: Orthopedics;  Laterality: Left;    No family history on file.  History  Substance Use Topics  . Smoking status: Never Smoker   . Smokeless tobacco: Never Used  . Alcohol Use: 0.0 oz/week     Comment: on weekends      Review of Systems  Respiratory: Positive for shortness of breath.   All other systems reviewed and are negative.    Allergies  Sulfonamide derivatives  Home Medications   Current Outpatient Rx  Name  Route  Sig  Dispense  Refill  . aspirin EC 325 MG tablet   Oral   Take 325 mg by mouth 2 (two) times daily.         . celecoxib (CELEBREX) 200 MG capsule   Oral   Take 200  mg by mouth 2 (two) times daily.         Tery Sanfilippo Sodium (DSS) 100 MG CAPS   Oral   Take 100 mg by mouth 2 (two) times daily.         . ferrous sulfate 325 (65 FE) MG tablet   Oral   Take 325 mg by mouth 3 (three) times daily after meals.         Marland Kitchen HYDROmorphone (DILAUDID) 4 MG tablet   Oral   Take 8 mg by mouth every 4 (four) hours as needed for pain.         . methocarbamol (ROBAXIN) 500 MG tablet   Oral   Take 500 mg by mouth every 6 (six) hours as needed (For muscle spasms.).         Marland Kitchen omeprazole (PRILOSEC OTC) 20 MG tablet   Oral   Take 20 mg by mouth every morning.          . rosuvastatin (CRESTOR) 10 MG tablet   Oral   Take 10 mg by mouth at bedtime.         . traMADol (ULTRAM) 50 MG tablet   Oral   Take 50 mg by mouth every 6 (six) hours  as needed for pain.           There were no vitals taken for this visit.  Physical Exam  Nursing note and vitals reviewed. Constitutional: He is oriented to person, place, and time. He appears well-developed and well-nourished.  Non-toxic appearance. No distress.  HENT:  Head: Normocephalic and atraumatic.  Eyes: Conjunctivae, EOM and lids are normal. Pupils are equal, round, and reactive to light.  Neck: Normal range of motion. Neck supple. No tracheal deviation present. No mass present.  Cardiovascular: Normal rate, regular rhythm and normal heart sounds.  Exam reveals no gallop.   No murmur heard. Pulmonary/Chest: Effort normal and breath sounds normal. No stridor. No respiratory distress. He has no decreased breath sounds. He has no wheezes. He has no rhonchi. He has no rales.  Abdominal: Soft. Normal appearance and bowel sounds are normal. He exhibits no distension. There is no tenderness. There is no rebound and no CVA tenderness.  Musculoskeletal: Normal range of motion. He exhibits no edema and no tenderness.  Neurological: He is alert and oriented to person, place, and time. He has normal strength. No  cranial nerve deficit or sensory deficit. GCS eye subscore is 4. GCS verbal subscore is 5. GCS motor subscore is 6.  Skin: Skin is warm and dry. No abrasion and no rash noted.  Psychiatric: He has a normal mood and affect. His speech is normal and behavior is normal.    ED Course  Procedures (including critical care time)  Labs Reviewed - No data to display No results found.   No diagnosis found.    MDM  Patient with chest CT that showed pulmonary embolism. Patient given medication for pain and started on heparin drip per pharmacy. He'll be admitted to the hospital   Date: 05/29/2012  Rate: 69  Rhythm: normal sinus rhythm  QRS Axis: normal  Intervals: normal  ST/T Wave abnormalities: normal  Conduction Disutrbances:first-degree A-V block   Narrative Interpretation:   Old EKG Reviewed: none available        Toy Baker, MD 05/29/12 1244

## 2012-05-29 NOTE — Progress Notes (Signed)
ANTICOAGULATION CONSULT NOTE - Initial Consult  Pharmacy Consult for Lovenox/Warfarin Indication: Pulmonary embolus   Allergies  Allergen Reactions  . Sulfonamide Derivatives Rash    Patient Measurements: Height: 6' (182.9 cm) Weight: 270 lb 11.6 oz (122.8 kg) IBW/kg (Calculated) : 77.6 Heparin Dosing Weight: 105.6kg  Vital Signs: Temp: 98.1 F (36.7 C) (02/12 1527) Temp src: Oral (02/12 1527) BP: 124/71 mmHg (02/12 1527) Pulse Rate: 70 (02/12 1527)  Labs:  Recent Labs  05/29/12 1046 05/29/12 1300  HGB 11.8*  --   HCT 34.3*  --   PLT 323  --   APTT  --  33  LABPROT  --  14.2  INR  --  1.11  CREATININE 0.78  --     Estimated Creatinine Clearance: 136.2 ml/min (by C-G formula based on Cr of 0.78).   Medical History: Past Medical History  Diagnosis Date  . HH (hiatus hernia)   . Barrett's esophagus   . Arthritis   . GERD (gastroesophageal reflux disease)   . Hypercholesteremia     Medications:  Scheduled:  . atorvastatin  20 mg Oral q1800  . enoxaparin (LOVENOX) injection  120 mg Subcutaneous Q12H  . enoxaparin   Does not apply Once  . [COMPLETED] heparin  5,000 Units Intravenous Once  . [COMPLETED]  morphine injection  4 mg Intravenous Once  . [COMPLETED] morphine      . [START ON 05/30/2012] pantoprazole  40 mg Oral Daily  . sodium chloride  3 mL Intravenous Q12H  . warfarin  10 mg Oral ONCE-1800  . Warfarin - Pharmacist Dosing Inpatient   Does not apply q1800  . [DISCONTINUED] omeprazole  40 mg Oral q morning - 10a   Infusions:  . [DISCONTINUED] sodium chloride 20 mL/hr at 05/29/12 1135  . [DISCONTINUED] heparin 1,600 Units/hr (05/29/12 1336)   PRN: acetaminophen, acetaminophen, HYDROmorphone (DILAUDID) injection, [COMPLETED] iohexol, methocarbamol, ondansetron (ZOFRAN) IV, ondansetron, traMADol  Assessment: 56 yom s/p L TKA 1/31, discharged 2/3, returned 2/12 with SOB. Chest CT positive for large burden of pulmonary emboli to the lungs  bilaterally. MD ordered to start IV heparin. Baseline CBC okay. No baseline PT/INR or PTT.  Last weight on file = 125.6 kg on 05/17/2012. Goal of Therapy: Heparin level (LMWH) = 0.6-1.2units/ml    Plan:  Lovenox 120mg  SQ q 12 hours (starting at 5pm) Warfarin 10mg  x 1 PT/INR daily and CBC  Loletta Specter 05/29/2012,4:08 PM

## 2012-05-29 NOTE — Progress Notes (Signed)
ANTICOAGULATION CONSULT NOTE - Initial Consult  Pharmacy Consult for IV heparin Indication: pulmonary embolus  Allergies  Allergen Reactions  . Sulfonamide Derivatives Rash    Patient Measurements:   Height: 6' 0.05" (183 cm) (last documented 05/17/2012) Weight: 276 lb 14.4 oz (125.6 kg) (last documented 05/17/2012) IBW/kg (Calculated) : 77.71 Heparin dosing weight: 105.6 kg  Vital Signs: Temp: 98.4 F (36.9 C) (02/12 1220) Temp src: Oral (02/12 1036) BP: 132/74 mmHg (02/12 1220) Pulse Rate: 75 (02/12 1220)  Labs:  Recent Labs  05/29/12 1046  HGB 11.8*  HCT 34.3*  PLT 323  CREATININE 0.78    The CrCl is unknown because both a height and weight (above a minimum accepted value) are required for this calculation.   Medical History: Past Medical History  Diagnosis Date  . HH (hiatus hernia)   . Barrett's esophagus   . Arthritis   . GERD (gastroesophageal reflux disease)   . Hypercholesteremia     Assessment:  56 yom s/p L TKA 1/31, discharged 2/3, returned 2/12 with SOB. Chest CT positive for large burden of pulmonary emboli to the lungs bilaterally.  MD ordered to start IV heparin. Baseline CBC okay.  No baseline PT/INR or PTT.    Last weight on file = 125.6 kg on 05/17/2012.  Goal of Therapy:  INR 2-3 Monitor platelets by anticoagulation protocol: Yes   Plan:   IV heparin 5000 units x 1 as bolus then IV heparin infusion at 1600 units/hr (~16 units/kg/hr)  Pharmacy will f/u with baseline labs   Heparin level 6 hours after infusion started  Daily heparin level and CBC  Follow up with long-term anticoagulation  Geoffry Paradise, PharmD, BCPS Pager: [phone] 1:02 PM Pharmacy #: [phone]

## 2012-05-29 NOTE — ED Notes (Signed)
BJY:[id number]<FA> Expected date:05/29/12<BR> Expected time:<BR> Means of arrival:<BR> Comments:<BR> EMS

## 2012-05-30 DIAGNOSIS — I82409 Acute embolism and thrombosis of unspecified deep veins of unspecified lower extremity: Secondary | ICD-10-CM

## 2012-05-30 DIAGNOSIS — I2699 Other pulmonary embolism without acute cor pulmonale: Secondary | ICD-10-CM

## 2012-05-30 DIAGNOSIS — Z86718 Personal history of other venous thrombosis and embolism: Secondary | ICD-10-CM | POA: Diagnosis present

## 2012-05-30 LAB — CBC
HCT: 31.9 % — ABNORMAL LOW (ref 39.0–52.0)
Hemoglobin: 10.7 g/dL — ABNORMAL LOW (ref 13.0–17.0)
MCH: 31.5 pg (ref 26.0–34.0)
MCV: 93.8 fL (ref 78.0–100.0)
Platelets: 277 10*3/uL (ref 150–400)
RBC: 3.4 MIL/uL — ABNORMAL LOW (ref 4.22–5.81)

## 2012-05-30 LAB — BASIC METABOLIC PANEL
BUN: 12 mg/dL (ref 6–23)
CO2: 27 mEq/L (ref 19–32)
Calcium: 8.8 mg/dL (ref 8.4–10.5)
Creatinine, Ser: 0.84 mg/dL (ref 0.50–1.35)
Glucose, Bld: 99 mg/dL (ref 70–99)

## 2012-05-30 MED ORDER — OXYCODONE HCL ER 15 MG PO T12A
15.0000 mg | EXTENDED_RELEASE_TABLET | Freq: Two times a day (BID) | ORAL | Status: DC
  Administered 2012-05-30 – 2012-05-31 (×3): 15 mg via ORAL
  Filled 2012-05-30 (×3): qty 1

## 2012-05-30 MED ORDER — HYDROMORPHONE HCL 2 MG PO TABS
4.0000 mg | ORAL_TABLET | ORAL | Status: DC | PRN
  Administered 2012-05-30 – 2012-05-31 (×5): 4 mg via ORAL
  Filled 2012-05-30 (×5): qty 2

## 2012-05-30 MED ORDER — ZOLPIDEM TARTRATE 10 MG PO TABS
10.0000 mg | ORAL_TABLET | Freq: Every evening | ORAL | Status: DC | PRN
  Administered 2012-05-30: 10 mg via ORAL
  Filled 2012-05-30: qty 1

## 2012-05-30 MED ORDER — WARFARIN SODIUM 10 MG PO TABS
10.0000 mg | ORAL_TABLET | Freq: Once | ORAL | Status: AC
  Administered 2012-05-30: 10 mg via ORAL
  Filled 2012-05-30: qty 1

## 2012-05-30 NOTE — Progress Notes (Signed)
[facility] PROGRESS NOTE  Russell Sanford NFA:[id number] DOB: September 16, 1955 DOA: 05/29/2012 PCP: Carollee Herter, MD  Assessment/Plan:  Bilateral PE with moderate clot burden Patient started on Lovenox and Coumadin per pharmacy. Hemodynamically stable and shortness of breath now improved. No evidence of right heart strain seen on EKG and no evidence of pulmonary arterial hypertension or right-sided heart strain noted on CT scan. Secondary to recent left total knee arthroplasty and left lower leg DVT found on Doppler today. -Given clot burden will need 3-6 months of anticoagulation Lovenox teaching being done and can possibly be discharged on Lovenox alone.  Left total knee arthroplasty Pain control with Dilaudid. Patient planes of persistent pain over left knee and getting second dose of Dilaudid at home. I will continue the same and add a long-acting OxyContin as well. Plan for follow up with Dr. Thomasena Edis in one week.  Hyperlipidemia Continue statin  GERD Continue PPI  Code Status: Full code Family Communication: Wife at bedside Disposition Plan: Home likely tomorrow if breathing stable   Consultants:  None  Procedures:  None  Antibiotics:  None  HPI/Subjective: Informs his shortness of breath to be better today and denies any wheezing. Complain of left knee pain  Objective: Filed Vitals:   05/29/12 1220 05/29/12 1527 05/29/12 2125 05/30/12 0430  BP: 132/74 124/71 108/62 119/69  Pulse: 75 70 70 70  Temp: 98.4 F (36.9 C) 98.1 F (36.7 C) 97.2 F (36.2 C) 97.4 F (36.3 C)  TempSrc:  Oral Oral Oral  Resp: 16  18 18   Height: 6' 0.05" (1.83 m) 6' (1.829 m)    Weight: 125.6 kg (276 lb 14.4 oz) 122.8 kg (270 lb 11.6 oz)    SpO2: 98% 99% 97% 98%    Intake/Output Summary (Last 24 hours) at 05/30/12 1313 Last data filed at 05/30/12 1300  Gross per 24 hour  Intake    600 ml  Output    400 ml  Net    200 ml   Filed Weights   05/29/12 1220 05/29/12 1527   Weight: 125.6 kg (276 lb 14.4 oz) 122.8 kg (270 lb 11.6 oz)    Exam:   General:  Middle aged male lying in bed in no acute distress  HEENT: No pallor, moist oral mucosa  Cardiovascular: Normal S1 and S2, no murmurs rub or gallop  Respiratory: Clear to auscultation bilaterally no added sounds  Abdomen: Soft, nontender, nondistended, bowel sounds present  Extremities: Swelling over left knee with intact dressing, unable to fully flex the knee, no calf swellings  CNS: AAO x3  Data Reviewed: Basic Metabolic Panel:  Recent Labs Lab 05/29/12 1046 05/30/12 0500  NA 137 137  K 3.8 4.1  CL 102 102  CO2 24 27  GLUCOSE 106* 99  BUN 10 12  CREATININE 0.78 0.84  CALCIUM 9.1 8.8   Liver Function Tests:  Recent Labs Lab 05/29/12 1046  AST 25  ALT 25  ALKPHOS 95  BILITOT 1.2  PROT 7.1  ALBUMIN 3.7   No results found for this basename: LIPASE, AMYLASE,  in the last 168 hours No results found for this basename: AMMONIA,  in the last 168 hours CBC:  Recent Labs Lab 05/29/12 1046 05/29/12 1728 05/30/12 0500  WBC 8.0 8.1 6.7  NEUTROABS 6.4  --   --   HGB 11.8* 11.6* 10.7*  HCT 34.3* 34.0* 31.9*  MCV 92.2 92.9 93.8  PLT 323 311 277   Cardiac Enzymes: No results found for this basename:  CKTOTAL, CKMB, CKMBINDEX, TROPONINI,  in the last 168 hours BNP (last 3 results) No results found for this basename: PROBNP,  in the last 8760 hours CBG: No results found for this basename: GLUCAP,  in the last 168 hours  No results found for this or any previous visit (from the past 240 hour(s)).   Studies: Dg Chest 2 View  05/29/2012  *RADIOLOGY REPORT*  Clinical Data: Chest pain, shortness of breath, recent knee surgery  CHEST - 2 VIEW  Comparison: 05/13/2009  Findings: Mild enlargement of cardiac silhouette. Tortuous aorta. Pulmonary vascularity questionably slightly congested. No acute infiltrate, pleural effusion or pneumothorax. Bones unremarkable.  IMPRESSION: Mild  enlargement of cardiac silhouette with question slight pulmonary vascular congestion. No acute infiltrate.   Original Report Authenticated By: Ulyses Southward, M.D.    Ct Angio Chest Pe W/cm &/or Wo Cm  05/29/2012  *RADIOLOGY REPORT*  Clinical Data: Chest pain and shortness of breath.  CT ANGIOGRAPHY CHEST  Technique:  Multidetector CT imaging of the chest using the standard protocol during bolus administration of intravenous contrast. Multiplanar reconstructed images including MIPs were obtained and reviewed to evaluate the vascular anatomy.  Contrast: OMNIPAQUE IOHEXOL 350 MG/ML SOLN  Comparison: No priors.  Findings:  Mediastinum: There are numerous filling defects throughout the pulmonary arterial system of the lungs bilaterally, compatible with a large volume of pulmonary emboli.  The largest of these emboli is within the distal aspect of the right main pulmonary artery, however, multiple emboli are noted within the lobar, segmental and subsegmental sized branches to the lungs bilaterally, with a slightly larger clot burden in the right lung when compared with the left.  At this time, there is no dilatation of the pulmonic trunk to suggest pulmonary arterial hypertension, and no overt imaging findings to strongly suggest the presence of right-sided heart strain.  The heart is mildly enlarged. There is atherosclerosis of the thoracic aorta, the great vessels of the mediastinum and the coronary arteries, including calcified atherosclerotic plaque in the left main, left anterior descending and right coronary arteries. No pathologically enlarged mediastinal or hilar lymph nodes. Esophagus is unremarkable in appearance.  Lungs/Pleura: No acute consolidative airspace disease.  No pleural effusions.  Image 168 of series 11 demonstrates a 1.2 x 1.9 cm nodular density in the right lung associated with the right major fissure.  No other suspicious appearing pulmonary nodules or masses are identified.  Upper Abdomen:  Unremarkable.  Musculoskeletal: There are no aggressive appearing lytic or blastic lesions noted in the visualized portions of the skeleton.  IMPRESSION: 1.  Study is positive for a large burden of pulmonary emboli to the lungs bilaterally, as above.  At this time, there is no evidence of pulmonary hemorrhage from pulmonary infarction, and no findings to suggest pulmonary arterial hypertension or right-sided heart strain. 2.  1.2 x 1.9 cm nodular density in the right lung associated within the right major fissure.  It is uncertain whether not this is an enlarged lymph node, or an actual pulmonary nodule.  At this time, short-term follow up contrast enhanced chest CT in 2-3 months is recommended to reevaluate this finding. 3. Atherosclerosis, including left main and two-vessel coronary artery disease. Please note that although the presence of coronary artery calcium documents the presence of coronary artery disease, the severity of this disease and any potential stenosis cannot be assessed on this non-gated CT examination.  Assessment for potential risk factor modification, dietary therapy or pharmacologic therapy may be warranted, if clinically indicated.  These results were called by telephone on 05/29/2012 at 12:22 p.m. to Dr. Freida Busman, who verbally acknowledged these results.   Original Report Authenticated By: Trudie Reed, M.D.     Scheduled Meds: . atorvastatin  20 mg Oral q1800  . enoxaparin (LOVENOX) injection  120 mg Subcutaneous Q12H  . OxyCODONE  15 mg Oral Q12H  . pantoprazole  40 mg Oral Daily  . sodium chloride  3 mL Intravenous Q12H  . warfarin  10 mg Oral ONCE-1800  . Warfarin - Pharmacist Dosing Inpatient   Does not apply q1800   Continuous Infusions:      Time spent: 25 minutes    Russell Sanford  [facility] Pager [phone]. If 8PM-8AM, please contact night-coverage at [email or web address], password [employer] 05/30/2012, 1:13 PM  LOS: 1 day

## 2012-05-30 NOTE — Progress Notes (Signed)
Right:  No evidence of DVT, superficial thrombosis, or Baker's cyst.  Left: DVT noted in the posterior tibial vein.  No evidence of superficial thrombosis.  No Baker's cyst.   

## 2012-05-30 NOTE — Progress Notes (Signed)
Began Lovenox teaching with patient.   He was able to teach back and verbalized confidence in administering injections at home if needed.

## 2012-05-30 NOTE — Progress Notes (Signed)
ANTICOAGULATION CONSULT NOTE - Follow Up  Pharmacy Consult for Lovenox/Warfarin Indication: Pulmonary embolus   Allergies  Allergen Reactions  . Sulfonamide Derivatives Rash    Patient Measurements: Height: 6' (182.9 cm) Weight: 270 lb 11.6 oz (122.8 kg) IBW/kg (Calculated) : 77.6 Heparin Dosing Weight: 105.6kg  Vital Signs: Temp: 97.4 F (36.3 C) (02/13 0430) Temp src: Oral (02/13 0430) BP: 119/69 mmHg (02/13 0430) Pulse Rate: 70 (02/13 0430)  Labs:  Recent Labs  05/29/12 1046 05/29/12 1300 05/29/12 1728 05/30/12 0500  HGB 11.8*  --  11.6* 10.7*  HCT 34.3*  --  34.0* 31.9*  PLT 323  --  311 277  APTT  --  33  --   --   LABPROT  --  14.2  --  14.5  INR  --  1.11  --  1.15  CREATININE 0.78  --   --  0.84    Estimated Creatinine Clearance: 129.8 ml/min (by C-G formula based on Cr of 0.84).   Anticoag or Interacting Medications:  2/12 >> Heparin >> 2/12 2/12 >> Lovenox 120mg  SQ q12h 2/12 >> Warfarin >>  Assessment: 57 yo M s/p L TKA 1/31, discharged 2/3, returned 2/12 with SOB. Chest CT positive for pulmonary emboli to the lungs bilaterally.     Today is Day #2 of 5 day minimum Lovenox/Warfarin overlap for new B/L PE  INR is subtherapeutic as expected after only 1 dose of warfarin.  Plts wnl  No bleeding reported in chart notes  Pt will need 3-6 months of anticoagulation per MD note  Goal of Therapy: INR 2-3   Plan:  1) Repeat Warfarin 10gm PO x1 2) Continue Lovenox 1mg /kg q12h for a total of 5 days AND until INR > 2 x2 consecutive days 3) Daily PT/INR 4) Warfarin education prior to discharge 5) Lovenox teaching kit already ordered on 2/12.  Darrol Angel, PharmD Pager: [phone] 05/30/2012,8:23 AM

## 2012-05-31 ENCOUNTER — Telehealth: Payer: Self-pay

## 2012-05-31 DIAGNOSIS — M129 Arthropathy, unspecified: Secondary | ICD-10-CM

## 2012-05-31 LAB — PROTIME-INR: Prothrombin Time: 14.6 seconds (ref 11.6–15.2)

## 2012-05-31 MED ORDER — RIVAROXABAN 20 MG PO TABS: 20.0000 mg | ORAL_TABLET | Freq: Every day | ORAL | Status: DC

## 2012-05-31 MED ORDER — OXYCODONE HCL ER 15 MG PO T12A: 15.0000 mg | EXTENDED_RELEASE_TABLET | Freq: Two times a day (BID) | ORAL | Status: DC

## 2012-05-31 MED ORDER — RIVAROXABAN 15 MG PO TABS: 15.0000 mg | ORAL_TABLET | Freq: Two times a day (BID) | ORAL | Status: DC

## 2012-05-31 NOTE — Telephone Encounter (Signed)
CALLED PT TO MAKE SURE HE HAD A FOLLOW UP APT.PER JCL LEFT MESSAGE TO PLEASE MAKE A FOLLOW UP APT

## 2012-05-31 NOTE — Discharge Summary (Signed)
Physician Discharge Summary  Russell Sanford YQM:[id number] DOB: Dec 12, 1955 DOA: 05/29/2012  PCP: Carollee Herter, MD  Admit date: 05/29/2012 Discharge date: 05/31/2012  Time spent: 40  minutes  Recommendations for Outpatient Follow-up:  1. Home with outpatient PCP and orthopedics follow up  Discharge Diagnoses:  Principal Problem:   Pulmonary embolism, bilateral  Active Problems:  DVT (deep venous thrombosis)   GERD   S/P total knee arthroplasty     Discharge Condition: fair  Diet recommendation: regular  Filed Weights   05/29/12 1220 05/29/12 1527  Weight: 125.6 kg (276 lb 14.4 oz) 122.8 kg (270 lb 11.6 oz)    History of present illness:  Please refer to admission H&P for details but in brief,   Hospital Course:  Bilateral PE with moderate clot burden  Patient started on Lovenox and Coumadin per pharmacy. Hemodynamically stable and shortness of breath now resolved. No evidence of right heart strain seen on EKG and no evidence of pulmonary arterial hypertension or right-sided heart strain noted on CT scan.  Secondary to recent left total knee arthroplasty and left  Tibial DVT found on Doppler. -although this is provoked by DVT from recent left TKA , Given clot burden would recommend 6 monthsof anticoagulation  -patient was placed on lovenox and coumadin, but can be discharged on xeralto 15 mg bid for 3 weeks and then 20 mg daily thereafter. Follow up with PCP as outpatient.   Left total knee arthroplasty  Pain control with Dilaudid as outpatient. He does c/o significant left knee pain with ambulation possibly with presence of clot as well. Added oxycontin as well with better pain control. Given that he will be on xeralto , he will not need full dose of aspirin for DVT prophylaxis and i will discontinue it,  Hyperlipidemia  Continue statin   GERD  Continue PPI   Code Status: Full code  Family Communication: Wife at bedside  Disposition Plan: Home with outpt  PCP and ortho f/up. Will call Dr Thomasena Edis to update  Consultants:  None   Procedures:  None   Antibiotics:  None        Discharge Exam: Filed Vitals:   05/30/12 0430 05/30/12 1416 05/30/12 2047 05/31/12 0430  BP: 119/69 125/57 120/70 137/68  Pulse: 70 62 64 66  Temp: 97.4 F (36.3 C) 98.4 F (36.9 C) 97.9 F (36.6 C) 98 F (36.7 C)  TempSrc: Oral Oral Oral Oral  Resp: 18 18 18 16   Height:      Weight:      SpO2: 98% 97% 96% 98%    General: Middle aged male lying in bed in no acute distress  HEENT: No pallor, moist oral mucosa  Cardiovascular: Normal S1 and S2, no murmurs rub or gallop  Respiratory: Clear to auscultation bilaterally no added sounds  Abdomen: Soft, nontender, nondistended, bowel sounds present  Extremities: Swelling over left knee with intact dressing, unable to fully flex the knee, no calf swellings  CNS: AAO x3   Discharge Instructions     Medication List    STOP taking these medications       aspirin EC 325 MG tablet      TAKE these medications       celecoxib 200 MG capsule  Commonly known as:  CELEBREX  Take 200 mg by mouth 2 (two) times daily.     DSS 100 MG Caps  Take 100 mg by mouth 2 (two) times daily.     ferrous sulfate 325 (65 FE)  MG tablet  Take 325 mg by mouth 3 (three) times daily after meals.     HYDROmorphone 4 MG tablet  Commonly known as:  DILAUDID  Take 8 mg by mouth every 4 (four) hours as needed for pain.     methocarbamol 500 MG tablet  Commonly known as:  ROBAXIN  Take 500 mg by mouth every 6 (six) hours as needed (For muscle spasms.).     omeprazole 20 MG tablet  Commonly known as:  PRILOSEC OTC  Take 20 mg by mouth every morning.     OxyCODONE 15 mg T12a  Commonly known as:  OXYCONTIN  Take 1 tablet (15 mg total) by mouth every 12 (twelve) hours.     Rivaroxaban 15 MG Tabs tablet  Commonly known as:  XARELTO  Take 1 tablet (15 mg total) by mouth 2 (two) times daily.     Rivaroxaban 20 MG  Tabs  Commonly known as:  XARELTO  Take 1 tablet (20 mg total) by mouth daily.  Start taking on:  06/22/2012     rosuvastatin 10 MG tablet  Commonly known as:  CRESTOR  Take 10 mg by mouth at bedtime.     traMADol 50 MG tablet  Commonly known as:  ULTRAM  Take 50 mg by mouth every 6 (six) hours as needed for pain.           Follow-up Information   Follow up with Carollee Herter, MD. Schedule an appointment as soon as possible for a visit in 1 week.   Contact information:   [address] [place] [address] [phone]       Follow up with Erasmo Leventhal, MD On 06/03/2012. (Has appointment)    Contact information:   [address] [place] [address] [phone]        The results of significant diagnostics from this hospitalization (including imaging, microbiology, ancillary and laboratory) are listed below for reference.    Significant Diagnostic Studies: Dg Chest 2 View  05/29/2012  *RADIOLOGY REPORT*  Clinical Data: Chest pain, shortness of breath, recent knee surgery  CHEST - 2 VIEW  Comparison: 05/13/2009  Findings: Mild enlargement of cardiac silhouette. Tortuous aorta. Pulmonary vascularity questionably slightly congested. No acute infiltrate, pleural effusion or pneumothorax. Bones unremarkable.  IMPRESSION: Mild enlargement of cardiac silhouette with question slight pulmonary vascular congestion. No acute infiltrate.   Original Report Authenticated By: Ulyses Southward, M.D.    Ct Angio Chest Pe W/cm &/or Wo Cm  05/29/2012  *RADIOLOGY REPORT*  Clinical Data: Chest pain and shortness of breath.  CT ANGIOGRAPHY CHEST  Technique:  Multidetector CT imaging of the chest using the standard protocol during bolus administration of intravenous contrast. Multiplanar reconstructed images including MIPs were obtained and reviewed to evaluate the vascular anatomy.  Contrast: OMNIPAQUE IOHEXOL 350 MG/ML SOLN   Comparison: No priors.  Findings:  Mediastinum: There are numerous filling defects throughout the pulmonary arterial system of the lungs bilaterally, compatible with a large volume of pulmonary emboli.  The largest of these emboli is within the distal aspect of the right main pulmonary artery, however, multiple emboli are noted within the lobar, segmental and subsegmental sized branches to the lungs bilaterally, with a slightly larger clot burden in the right lung when compared with the left.  At this time, there is no dilatation of the pulmonic trunk to suggest pulmonary arterial hypertension, and no overt imaging findings to strongly suggest the presence of right-sided heart  strain.  The heart is mildly enlarged. There is atherosclerosis of the thoracic aorta, the great vessels of the mediastinum and the coronary arteries, including calcified atherosclerotic plaque in the left main, left anterior descending and right coronary arteries. No pathologically enlarged mediastinal or hilar lymph nodes. Esophagus is unremarkable in appearance.  Lungs/Pleura: No acute consolidative airspace disease.  No pleural effusions.  Image 168 of series 11 demonstrates a 1.2 x 1.9 cm nodular density in the right lung associated with the right major fissure.  No other suspicious appearing pulmonary nodules or masses are identified.  Upper Abdomen: Unremarkable.  Musculoskeletal: There are no aggressive appearing lytic or blastic lesions noted in the visualized portions of the skeleton.  IMPRESSION: 1.  Study is positive for a large burden of pulmonary emboli to the lungs bilaterally, as above.  At this time, there is no evidence of pulmonary hemorrhage from pulmonary infarction, and no findings to suggest pulmonary arterial hypertension or right-sided heart strain. 2.  1.2 x 1.9 cm nodular density in the right lung associated within the right major fissure.  It is uncertain whether not this is an enlarged lymph node, or an actual  pulmonary nodule.  At this time, short-term follow up contrast enhanced chest CT in 2-3 months is recommended to reevaluate this finding. 3. Atherosclerosis, including left main and two-vessel coronary artery disease. Please note that although the presence of coronary artery calcium documents the presence of coronary artery disease, the severity of this disease and any potential stenosis cannot be assessed on this non-gated CT examination.  Assessment for potential risk factor modification, dietary therapy or pharmacologic therapy may be warranted, if clinically indicated.  These results were called by telephone on 05/29/2012 at 12:22 p.m. to Dr. Freida Busman, who verbally acknowledged these results.   Original Report Authenticated By: Trudie Reed, M.D.     Microbiology: No results found for this or any previous visit (from the past 240 hour(s)).   Labs: Basic Metabolic Panel:  Recent Labs Lab 05/29/12 1046 05/30/12 0500  NA 137 137  K 3.8 4.1  CL 102 102  CO2 24 27  GLUCOSE 106* 99  BUN 10 12  CREATININE 0.78 0.84  CALCIUM 9.1 8.8   Liver Function Tests:  Recent Labs Lab 05/29/12 1046  AST 25  ALT 25  ALKPHOS 95  BILITOT 1.2  PROT 7.1  ALBUMIN 3.7   No results found for this basename: LIPASE, AMYLASE,  in the last 168 hours No results found for this basename: AMMONIA,  in the last 168 hours CBC:  Recent Labs Lab 05/29/12 1046 05/29/12 1728 05/30/12 0500  WBC 8.0 8.1 6.7  NEUTROABS 6.4  --   --   HGB 11.8* 11.6* 10.7*  HCT 34.3* 34.0* 31.9*  MCV 92.2 92.9 93.8  PLT 323 311 277   Cardiac Enzymes: No results found for this basename: CKTOTAL, CKMB, CKMBINDEX, TROPONINI,  in the last 168 hours BNP: BNP (last 3 results) No results found for this basename: PROBNP,  in the last 8760 hours CBG: No results found for this basename: GLUCAP,  in the last 168 hours     Signed:  Loza Prell  [facility] 05/31/2012, 12:59 PM

## 2012-06-10 ENCOUNTER — Encounter: Payer: Self-pay | Admitting: Family Medicine

## 2012-06-10 ENCOUNTER — Ambulatory Visit (INDEPENDENT_AMBULATORY_CARE_PROVIDER_SITE_OTHER): Payer: BC Managed Care – PPO | Admitting: Family Medicine

## 2012-06-10 VITALS — BP 112/70 | HR 70 | Wt 263.0 lb

## 2012-06-10 DIAGNOSIS — Z96659 Presence of unspecified artificial knee joint: Secondary | ICD-10-CM

## 2012-06-10 DIAGNOSIS — Z96652 Presence of left artificial knee joint: Secondary | ICD-10-CM

## 2012-06-10 DIAGNOSIS — E785 Hyperlipidemia, unspecified: Secondary | ICD-10-CM

## 2012-06-10 DIAGNOSIS — I82409 Acute embolism and thrombosis of unspecified deep veins of unspecified lower extremity: Secondary | ICD-10-CM

## 2012-06-10 DIAGNOSIS — I82402 Acute embolism and thrombosis of unspecified deep veins of left lower extremity: Secondary | ICD-10-CM

## 2012-06-10 DIAGNOSIS — I2699 Other pulmonary embolism without acute cor pulmonale: Secondary | ICD-10-CM

## 2012-06-10 MED ORDER — ROSUVASTATIN CALCIUM 10 MG PO TABS: 10.0000 mg | ORAL_TABLET | Freq: Every day | ORAL | Status: DC

## 2012-06-10 NOTE — Progress Notes (Signed)
  Subjective:    Patient ID: Russell Sanford, male    DOB: 1956-01-30, 57 y.o.   MRN: [id number]  HPI He is here for followup after recent hospitalization for left total knee arthroplasty and subsequent PE. He was admitted on February 12 and sent home on the 14th. Initially he was placed on Lovenox and Coumadin however apparently was then switched to XARELTO 15 mg twice a day for 3 weeks. He was given a Elease Hashimoto the 20 mg dosing which we'll start at the end of the three-week time frame. He does have episodes of diaphoresis that usually occur in the morning however goes away within less than an hour. He also is had episodes of diaphoresis again these things less very short period of time. He does feel weak however he has been reluctant to become more physically active due to the PE. He is involved in physical therapy. His orthopedic surgeon has deferred when he returns to work to me according to the patient. He would also like a refill on his Crestor.   Review of Systems     Objective:   Physical Exam Alert and in no distress. Previous medical record was reviewed. Exam of the left leg shows a healing suture scar over the knee. Homan's sign is negative.last lipid level was in November       Assessment & Plan:  HYPERLIPIDEMIA - Plan: rosuvastatin (CRESTOR) 10 MG tablet  Pulmonary embolism, bilateral  S/P total knee arthroplasty, left  DVT (deep venous thrombosis), left I reassured him that I do not think the episodes of shortness of breath and diaphoresis for me great concern since her last just a short period time. Encouraged him to become more physically active to try and get ready to go back to work. 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 Discussed wound care and rehabilitation with him. Recheck here in one month.

## 2012-06-10 NOTE — Patient Instructions (Signed)
Listen to your body not your brain!!!! Slowly increase your physical activities based on your energy and stamina and you can go back to work but again go slowly

## 2012-07-01 ENCOUNTER — Observation Stay (HOSPITAL_COMMUNITY)
Admission: EM | Admit: 2012-07-01 | Discharge: 2012-07-03 | Disposition: A | Discharge status: Home / Self Care | Payer: BC Managed Care – PPO | Attending: Internal Medicine | Admitting: Internal Medicine

## 2012-07-01 ENCOUNTER — Emergency Department (HOSPITAL_COMMUNITY): Payer: BC Managed Care – PPO

## 2012-07-01 ENCOUNTER — Encounter (HOSPITAL_COMMUNITY): Payer: Self-pay

## 2012-07-01 DIAGNOSIS — I2699 Other pulmonary embolism without acute cor pulmonale: Secondary | ICD-10-CM

## 2012-07-01 DIAGNOSIS — R0602 Shortness of breath: Secondary | ICD-10-CM | POA: Insufficient documentation

## 2012-07-01 DIAGNOSIS — I2510 Atherosclerotic heart disease of native coronary artery without angina pectoris: Secondary | ICD-10-CM | POA: Insufficient documentation

## 2012-07-01 DIAGNOSIS — I4729 Other ventricular tachycardia: Secondary | ICD-10-CM | POA: Clinically undetermined

## 2012-07-01 DIAGNOSIS — Z86711 Personal history of pulmonary embolism: Secondary | ICD-10-CM | POA: Diagnosis present

## 2012-07-01 DIAGNOSIS — M7989 Other specified soft tissue disorders: Secondary | ICD-10-CM | POA: Insufficient documentation

## 2012-07-01 DIAGNOSIS — R079 Chest pain, unspecified: Principal | ICD-10-CM

## 2012-07-01 DIAGNOSIS — Z96652 Presence of left artificial knee joint: Secondary | ICD-10-CM

## 2012-07-01 DIAGNOSIS — E785 Hyperlipidemia, unspecified: Secondary | ICD-10-CM

## 2012-07-01 DIAGNOSIS — Z8249 Family history of ischemic heart disease and other diseases of the circulatory system: Secondary | ICD-10-CM

## 2012-07-01 HISTORY — DX: Other pulmonary embolism without acute cor pulmonale: I26.99

## 2012-07-01 HISTORY — DX: Acute embolism and thrombosis of unspecified deep veins of unspecified lower extremity: I82.409

## 2012-07-01 LAB — COMPREHENSIVE METABOLIC PANEL
ALT: 22 U/L (ref 0–53)
Albumin: 4.2 g/dL (ref 3.5–5.2)
Alkaline Phosphatase: 69 U/L (ref 39–117)
Calcium: 9.5 mg/dL (ref 8.4–10.5)
Potassium: 3.9 mEq/L (ref 3.5–5.1)
Sodium: 139 mEq/L (ref 135–145)
Total Protein: 7.2 g/dL (ref 6.0–8.3)

## 2012-07-01 LAB — CBC WITH DIFFERENTIAL/PLATELET
Basophils Absolute: 0 10*3/uL (ref 0.0–0.1)
Basophils Relative: 0 % (ref 0–1)
Eosinophils Absolute: 0 10*3/uL (ref 0.0–0.7)
MCH: 32 pg (ref 26.0–34.0)
MCHC: 36.2 g/dL — ABNORMAL HIGH (ref 30.0–36.0)
Neutrophils Relative %: 67 % (ref 43–77)
Platelets: 203 10*3/uL (ref 150–400)
RBC: 4.65 MIL/uL (ref 4.22–5.81)
RDW: 11.8 % (ref 11.5–15.5)

## 2012-07-01 LAB — PROTIME-INR: INR: 1.32 (ref 0.00–1.49)

## 2012-07-01 LAB — APTT: aPTT: 32 seconds (ref 24–37)

## 2012-07-01 MED ORDER — IOHEXOL 350 MG/ML SOLN
100.0000 mL | Freq: Once | INTRAVENOUS | Status: AC | PRN
  Administered 2012-07-01: 100 mL via INTRAVENOUS

## 2012-07-01 NOTE — ED Notes (Signed)
Pt from home with complaints of sudden onset of shortness of breath and midsternal chest pain. EMS states pain relief with nitro SL x2 and Aspirin 324 mg, pain went from 10/10-3/10. Pt denies any chest pain at this time but still reports shortness of breath.

## 2012-07-01 NOTE — ED Provider Notes (Signed)
History     CSN: [id number]  Arrival date & time 07/01/12  2007   First MD Initiated Contact with Patient 07/01/12 2010      Chief Complaint  Patient presents with  . Chest Pain  . Shortness of Breath    (Consider location/radiation/quality/duration/timing/severity/associated sxs/prior treatment) HPI Pt with sudden onset of SOB and mid sternal pressure starting 2 hour before arrival while sitting in chair. +diaphoresis and anxiety. Pt given aspirin and NTG x 2 with near resolution of pain. Pt states SOB is improved as well. Admits to LLE swelling and pain. Pt with previous history of PE with similar symptoms. States he is on xarelto and recently switched to 20 mg once a day.  Past Medical History  Diagnosis Date  . HH (hiatus hernia)   . Barrett's esophagus   . Arthritis   . GERD (gastroesophageal reflux disease)   . Hypercholesteremia   . DVT (deep venous thrombosis)     a. Posterior tibial vein DVT 05/2012 following L TKA.  . Pulmonary embolism     a. Large burden bilat PE on CTA 05/2012 following L TKA.    Past Surgical History  Procedure Laterality Date  . Knee arthroscopy      bilateral  . Shoulder arthroscopy      bilateral  . Toe amputation      left big toe  . Fracture surgery      left lower leg  . Lasik    . Total knee arthroplasty  05/17/2012    Procedure: TOTAL KNEE ARTHROPLASTY;  Surgeon: Eugenia Mcalpine, MD;  Location: [facility];  Service: Orthopedics;  Laterality: Left;    Family History  Problem Relation Age of Onset  . Heart attack Father 78    Father died at age 56 of massive MI  . Arrhythmia Mother     Scarlet fever -> "fibrillation"    History  Substance Use Topics  . Smoking status: Never Smoker   . Smokeless tobacco: Never Used  . Alcohol Use: No     Comment: used to drink heavily, quit January 2014      Review of Systems  Constitutional: Positive for diaphoresis. Negative for fever and chills.  Respiratory: Positive for shortness of  breath. Negative for cough.   Cardiovascular: Positive for chest pain and leg swelling. Negative for palpitations.  Gastrointestinal: Negative for nausea, vomiting and abdominal pain.  Musculoskeletal: Negative for myalgias and back pain.  Skin: Negative for pallor, rash and wound.  Neurological: Negative for weakness, light-headedness and headaches.  All other systems reviewed and are negative.    Allergies  Sulfonamide derivatives  Home Medications   No current outpatient prescriptions on file.  BP 111/63  Pulse 72  Temp(Src) 97.9 F (36.6 C) (Oral)  Resp 18  Ht 6\' 1"  (1.854 m)  Wt 250 lb 1.6 oz (113.445 kg)  BMI 33 kg/m2  SpO2 97%  Physical Exam  Nursing note and vitals reviewed. Constitutional: He is oriented to person, place, and time. He appears well-developed and well-nourished. No distress.  HENT:  Head: Normocephalic and atraumatic.  Mouth/Throat: Oropharynx is clear and moist.  Eyes: EOM are normal. Pupils are equal, round, and reactive to light.  Neck: Normal range of motion. Neck supple.  Cardiovascular: Normal rate and regular rhythm.   Pulmonary/Chest: Effort normal and breath sounds normal. No respiratory distress. He has no wheezes. He has no rales. He exhibits no tenderness.  Abdominal: Soft. Bowel sounds are normal. He exhibits no mass.  There is no tenderness. There is no rebound and no guarding.  Musculoskeletal: Normal range of motion. He exhibits edema (LLE swelling. no calf tenderness). He exhibits no tenderness.  Neurological: He is alert and oriented to person, place, and time.  Skin: Skin is warm and dry. No rash noted. No erythema.  Psychiatric:  Anxious     ED Course  Procedures (including critical care time)  Labs Reviewed  CBC WITH DIFFERENTIAL - Abnormal; Notable for the following:    MCHC 36.2 (*)    All other components within normal limits  COMPREHENSIVE METABOLIC PANEL - Abnormal; Notable for the following:    Glucose, Bld 123  (*)    All other components within normal limits  PROTIME-INR - Abnormal; Notable for the following:    Prothrombin Time 16.1 (*)    All other components within normal limits  BASIC METABOLIC PANEL - Abnormal; Notable for the following:    Glucose, Bld 110 (*)    All other components within normal limits  TROPONIN I  APTT  TROPONIN I  TROPONIN I  TROPONIN I  CBC  HEMOGLOBIN A1C  MAGNESIUM  BASIC METABOLIC PANEL  LIPID PANEL   Ct Angio Chest Pe W/cm &/or Wo Cm  07/02/2012  *RADIOLOGY REPORT*  Clinical Data: Shortness of breath.  Prior history of pulmonary embolism.  CT ANGIOGRAPHY CHEST  Technique:  Multidetector CT imaging of the chest using the standard protocol during bolus administration of intravenous contrast. Multiplanar reconstructed images including MIPs were obtained and reviewed to evaluate the vascular anatomy.  Contrast: OMNIPAQUE IOHEXOL 350 MG/ML SOLN  Comparison: CTA chest 05/29/2012.  Findings: Interval recanalization of the vessels that were involved with the bilateral pulmonary emboli previously.  No filling defects within either main pulmonary artery or their branches in this time to suggest recurrent acute pulmonary embolism.  No visible pulmonary arterial branch stenoses.  Normal heart size.  No evidence of right heart strain.  Mild to moderate LAD coronary atherosclerosis.  No visible atherosclerosis involving the thoracic or upper abdominal aorta or their visualized branches.  No pericardial effusion.  Pulmonary parenchyma clear without evidence of localized airspace consolidation, interstitial disease, or parenchymal nodules or masses.  No pleural effusions.  Central airways patent without significant bronchial wall thickening.  No significant mediastinal, hilar, or axillary lymphadenopathy. Thyroid gland unremarkable.  Visualized upper abdomen unremarkable for the early arterial phase of enhancement.  Bone window images demonstrate mid thoracic spondylosis.   IMPRESSION:  1.  No evidence of acute pulmonary embolism. 2.  Interval recanalization of the vessels involved with bilateral pulmonary emboli on the prior examination 1 month ago. 3.  No acute cardiopulmonary disease. 4.  Mild to moderate LAD coronary atherosclerosis.  These results were called by telephone on 07/02/2012 at 0035 hours to Dr. Ranae Palms of the emergency department, who verbally acknowledged these results.   Original Report Authenticated By: Hulan Saas, M.D.    Dg Chest Port 1 View  07/01/2012  *RADIOLOGY REPORT*  Clinical Data: Shortness of breath and dizziness tonight.  PORTABLE CHEST - 1 VIEW  Comparison: 05/29/2012  Findings: Minimal interstitial changes in the lungs suggesting slight fibrosis. The heart size and pulmonary vascularity are normal. The lungs appear clear and expanded without focal air space disease or consolidation. No blunting of the costophrenic angles. No pneumothorax mediastinal contours appear intact.  IMPRESSION: No evidence of active pulmonary disease.  Slight fibrosis in the lung bases.   Original Report Authenticated By: Burman Nieves, M.D.  1. Chest pain   2. Family history of heart disease in male family member before age 56   3. Pulmonary embolism, bilateral   4. S/P total knee arthroplasty, left      Date: 07/01/2012  Rate: 85  Rhythm: normal sinus rhythm  QRS Axis: normal  Intervals: PR prolonged  ST/T Wave abnormalities: nonspecific T wave changes  Conduction Disutrbances:first-degree A-V block   Narrative Interpretation:   Old EKG Reviewed: unchanged    MDM   Admit to medicine r/o CAD       Loren Racer, MD 07/02/12 2308

## 2012-07-02 ENCOUNTER — Encounter (HOSPITAL_COMMUNITY): Payer: Self-pay | Admitting: Radiology

## 2012-07-02 DIAGNOSIS — Z8249 Family history of ischemic heart disease and other diseases of the circulatory system: Secondary | ICD-10-CM

## 2012-07-02 DIAGNOSIS — Z96659 Presence of unspecified artificial knee joint: Secondary | ICD-10-CM

## 2012-07-02 DIAGNOSIS — R0602 Shortness of breath: Secondary | ICD-10-CM

## 2012-07-02 DIAGNOSIS — I2699 Other pulmonary embolism without acute cor pulmonale: Secondary | ICD-10-CM

## 2012-07-02 DIAGNOSIS — R079 Chest pain, unspecified: Secondary | ICD-10-CM

## 2012-07-02 LAB — CBC
Hemoglobin: 14.1 g/dL (ref 13.0–17.0)
MCV: 88.7 fL (ref 78.0–100.0)
Platelets: 194 10*3/uL (ref 150–400)
RBC: 4.43 MIL/uL (ref 4.22–5.81)
WBC: 7 10*3/uL (ref 4.0–10.5)

## 2012-07-02 LAB — TROPONIN I
Troponin I: <0.3 ng/mL (ref <0.30)
Troponin I: <0.3 ng/mL (ref <0.30)

## 2012-07-02 LAB — BASIC METABOLIC PANEL
CO2: 23 mEq/L (ref 19–32)
Chloride: 104 mEq/L (ref 96–112)
Sodium: 139 mEq/L (ref 135–145)

## 2012-07-02 MED ORDER — ZOLPIDEM TARTRATE 5 MG PO TABS
5.0000 mg | ORAL_TABLET | Freq: Every evening | ORAL | Status: DC | PRN
  Administered 2012-07-02: 5 mg via ORAL
  Filled 2012-07-02 (×2): qty 1

## 2012-07-02 MED ORDER — METHOCARBAMOL 500 MG PO TABS
500.0000 mg | ORAL_TABLET | Freq: Four times a day (QID) | ORAL | Status: DC | PRN
  Administered 2012-07-03: 500 mg via ORAL
  Filled 2012-07-02 (×2): qty 1

## 2012-07-02 MED ORDER — SODIUM CHLORIDE 0.9 % IV SOLN
INTRAVENOUS | Status: DC
  Administered 2012-07-02: 16:00:00 via INTRAVENOUS

## 2012-07-02 MED ORDER — REGADENOSON 0.4 MG/5ML IV SOLN
0.4000 mg | Freq: Once | INTRAVENOUS | Status: AC
  Administered 2012-07-03: 0.4 mg via INTRAVENOUS
  Filled 2012-07-02: qty 5

## 2012-07-02 MED ORDER — ASPIRIN 81 MG PO CHEW
81.0000 mg | CHEWABLE_TABLET | Freq: Every day | ORAL | Status: DC
  Administered 2012-07-02 – 2012-07-03 (×2): 81 mg via ORAL
  Filled 2012-07-02 (×2): qty 1

## 2012-07-02 MED ORDER — RIVAROXABAN 20 MG PO TABS
20.0000 mg | ORAL_TABLET | Freq: Every day | ORAL | Status: DC
  Administered 2012-07-02: 20 mg via ORAL
  Filled 2012-07-02 (×2): qty 1

## 2012-07-02 MED ORDER — PANTOPRAZOLE SODIUM 40 MG PO TBEC
40.0000 mg | DELAYED_RELEASE_TABLET | Freq: Every morning | ORAL | Status: DC
  Administered 2012-07-02 – 2012-07-03 (×2): 40 mg via ORAL
  Filled 2012-07-02 (×3): qty 1

## 2012-07-02 MED ORDER — SODIUM CHLORIDE 0.9 % IJ SOLN: 3.0000 mL | Freq: Two times a day (BID) | INTRAMUSCULAR | Status: DC

## 2012-07-02 MED ORDER — METOPROLOL TARTRATE 25 MG PO TABS
25.0000 mg | ORAL_TABLET | Freq: Two times a day (BID) | ORAL | Status: DC
  Administered 2012-07-02 – 2012-07-03 (×2): 25 mg via ORAL
  Filled 2012-07-02 (×4): qty 1

## 2012-07-02 MED ORDER — CELECOXIB 200 MG PO CAPS
200.0000 mg | ORAL_CAPSULE | Freq: Two times a day (BID) | ORAL | Status: DC
  Administered 2012-07-02 – 2012-07-03 (×3): 200 mg via ORAL
  Filled 2012-07-02 (×5): qty 1

## 2012-07-02 MED ORDER — ATORVASTATIN CALCIUM 20 MG PO TABS
20.0000 mg | ORAL_TABLET | Freq: Every day | ORAL | Status: DC
  Administered 2012-07-02: 20 mg via ORAL
  Filled 2012-07-02 (×2): qty 1

## 2012-07-02 NOTE — Progress Notes (Signed)
Utilization review completed.  

## 2012-07-02 NOTE — Progress Notes (Signed)
Nutrition Brief Note  Patient identified on the Malnutrition Screening Tool (MST) Report for recent weight loss without trying.    RD spoke with patient's wife while patient at procedure. Reports patient has lost some weight since his surgery; this is desirable given obesity.  Also reported his appetite has been good PTA.  Wt Readings from Last 10 Encounters:  07/02/12 250 lb 1.6 oz (113.445 kg)  06/10/12 263 lb (119.296 kg)  05/29/12 270 lb 11.6 oz (122.8 kg)  05/17/12 277 lb (125.646 kg)  05/17/12 277 lb (125.646 kg)  05/09/12 277 lb (125.646 kg)  03/28/12 273 lb (123.832 kg)  03/13/12 273 lb (123.832 kg)  05/02/11 268 lb (121.564 kg)  04/21/10 264 lb (119.75 kg)    Body mass index is 33 kg/(m^2). Patient meets criteria for Obesity Class I based on current BMI.   Current diet order is Heart Healthy. Labs and medications reviewed.   No nutrition interventions warranted at this time. If nutrition issues arise, please consult RD.   Maureen Chatters, RD, LDN Pager #: [phone] After-Hours Pager #: [phone]

## 2012-07-02 NOTE — H&P (Signed)
[facility] History and Physical  Russell Sanford XBM:[id number] DOB: Aug 24, 1955 DOA: 07/01/2012  Referring physician: ED PCP: Carollee Herter, MD  Specialists: [facility]  Chief Complaint: Chest Pain  HPI: Russell Sanford is a 57 y.o. male who presents with complaint of chest pain.  Symptoms onset at 8pm after having anxiety, associated with 10/10 severity pain and shortness of breath similar to prior PE he had last month.  Located mid sternally, pressure like qualtiy, relieved with NTG SL.  Patient had PE last month (provoked by L TKA) and is on Xaralto, has LLE swelling as well worse in the past 2 days.  In the ED CTA of his chest demonstrated no new acute PE, and apparent resolution of much of the prior heavy clot burden on his CTA last month.  Hospitalist has been asked to admit for CP r/o cardiac.  Review of Systems: 12 systems reviewed and otherwise negative.  Past Medical History  Diagnosis Date  . HH (hiatus hernia)   . Barrett's esophagus   . Arthritis   . GERD (gastroesophageal reflux disease)   . Hypercholesteremia    Past Surgical History  Procedure Laterality Date  . Knee arthroscopy      bilateral  . Shoulder arthroscopy      bilateral  . Toe amputation      left big toe  . Fracture surgery      left lower leg  . Lasik    . Total knee arthroplasty  05/17/2012    Procedure: TOTAL KNEE ARTHROPLASTY;  Surgeon: Eugenia Mcalpine, MD;  Location: [facility];  Service: Orthopedics;  Laterality: Left;   Social History:  reports that he has never smoked. He has never used smokeless tobacco. He reports that he does not drink alcohol or use illicit drugs.  Allergies  Allergen Reactions  . Sulfonamide Derivatives Rash    Family History  Problem Relation Age of Onset  . Heart attack Father 34    Father died at age 71 of massive MI    Prior to Admission medications   Medication Sig Start Date End Date Taking? Authorizing Provider  celecoxib (CELEBREX) 200  MG capsule Take 200 mg by mouth 2 (two) times daily.   Yes Historical Provider, MD  methocarbamol (ROBAXIN) 500 MG tablet Take 500 mg by mouth every 6 (six) hours as needed (For muscle spasms.). 05/20/12  Yes Jamelle Rushing, PA-C  omeprazole (PRILOSEC OTC) 20 MG tablet Take 20 mg by mouth every morning.    Yes Historical Provider, MD  Rivaroxaban (XARELTO) 20 MG TABS Take 1 tablet (20 mg total) by mouth daily. 06/22/12  Yes Nishant Dhungel, MD  rosuvastatin (CRESTOR) 10 MG tablet Take 1 tablet (10 mg total) by mouth at bedtime. 06/10/12  Yes Ronnald Nian, MD  Rivaroxaban (XARELTO) 15 MG TABS tablet Take 1 tablet (15 mg total) by mouth 2 (two) times daily. 05/31/12 06/21/12  Nishant Dhungel, MD   Physical Exam: Filed Vitals:   07/01/12 2244 07/01/12 2300 07/02/12 0030 07/02/12 0100  BP: 114/68 126/89 127/80 116/87  Pulse:  85 78 73  Temp:      TempSrc:      Resp: 18 33 20 18  SpO2: 100% 100% 98% 97%     General:  NAD, resting comfortably in bed  Eyes: PEERLA EOMI  ENT: mucous membranes moist  Neck: supple w/o JVD  Cardiovascular: RRR w/o MRG  Respiratory: CTA B  Abdomen: soft, nt, nd, bs+  Skin: no rash nor  lesion, surgical wound appears healed  Musculoskeletal: MAE, full ROM all 4 extremities, some LLE swelling compared to R  Psychiatric: normal tone and affect  Neurologic: AAOx3, grossly non-focal   Labs on Admission:  Basic Metabolic Panel:  Recent Labs Lab 07/01/12 2048  NA 139  K 3.9  CL 104  CO2 25  GLUCOSE 123*  BUN 10  CREATININE 0.77  CALCIUM 9.5   Liver Function Tests:  Recent Labs Lab 07/01/12 2048  AST 21  ALT 22  ALKPHOS 69  BILITOT 0.4  PROT 7.2  ALBUMIN 4.2   No results found for this basename: LIPASE, AMYLASE,  in the last 168 hours No results found for this basename: AMMONIA,  in the last 168 hours CBC:  Recent Labs Lab 07/01/12 2048  WBC 7.2  NEUTROABS 4.8  HGB 14.9  HCT 41.2  MCV 88.6  PLT 203   Cardiac Enzymes:  Recent  Labs Lab 07/01/12 2049  TROPONINI <0.30    BNP (last 3 results) No results found for this basename: PROBNP,  in the last 8760 hours CBG: No results found for this basename: GLUCAP,  in the last 168 hours  Radiological Exams on Admission: Ct Angio Chest Pe W/cm &/or Wo Cm  07/02/2012  *RADIOLOGY REPORT*  Clinical Data: Shortness of breath.  Prior history of pulmonary embolism.  CT ANGIOGRAPHY CHEST  Technique:  Multidetector CT imaging of the chest using the standard protocol during bolus administration of intravenous contrast. Multiplanar reconstructed images including MIPs were obtained and reviewed to evaluate the vascular anatomy.  Contrast: OMNIPAQUE IOHEXOL 350 MG/ML SOLN  Comparison: CTA chest 05/29/2012.  Findings: Interval recanalization of the vessels that were involved with the bilateral pulmonary emboli previously.  No filling defects within either main pulmonary artery or their branches in this time to suggest recurrent acute pulmonary embolism.  No visible pulmonary arterial branch stenoses.  Normal heart size.  No evidence of right heart strain.  Mild to moderate LAD coronary atherosclerosis.  No visible atherosclerosis involving the thoracic or upper abdominal aorta or their visualized branches.  No pericardial effusion.  Pulmonary parenchyma clear without evidence of localized airspace consolidation, interstitial disease, or parenchymal nodules or masses.  No pleural effusions.  Central airways patent without significant bronchial wall thickening.  No significant mediastinal, hilar, or axillary lymphadenopathy. Thyroid gland unremarkable.  Visualized upper abdomen unremarkable for the early arterial phase of enhancement.  Bone window images demonstrate mid thoracic spondylosis.  IMPRESSION:  1.  No evidence of acute pulmonary embolism. 2.  Interval recanalization of the vessels involved with bilateral pulmonary emboli on the prior examination 1 month ago. 3.  No acute  cardiopulmonary disease. 4.  Mild to moderate LAD coronary atherosclerosis.  These results were called by telephone on 07/02/2012 at 0035 hours to Dr. Ranae Palms of the emergency department, who verbally acknowledged these results.   Original Report Authenticated By: Hulan Saas, M.D.    Dg Chest Port 1 View  07/01/2012  *RADIOLOGY REPORT*  Clinical Data: Shortness of breath and dizziness tonight.  PORTABLE CHEST - 1 VIEW  Comparison: 05/29/2012  Findings: Minimal interstitial changes in the lungs suggesting slight fibrosis. The heart size and pulmonary vascularity are normal. The lungs appear clear and expanded without focal air space disease or consolidation. No blunting of the costophrenic angles. No pneumothorax mediastinal contours appear intact.  IMPRESSION: No evidence of active pulmonary disease.  Slight fibrosis in the lung bases.   Original Report Authenticated By: Burman Nieves, M.D.  EKG: Independently reviewed.  Assessment/Plan Principal Problem:   Chest pain Active Problems:   Pulmonary embolism, bilateral   1. Chest pain - admitting as r/o cardiac, keeping patient NPO for possible stress test, does have a concerning family history with father dying at age 5 of MI.  Checking serial troponins, chest pain resolved after NTG. 2. H/o B PE - continue xaralto, will re ultrasound his LLE to ensure no further DVT given the worsening of swelling however patient is on xaralto and the CT chest suggests that this is working as his clots have essentially all resorbed in the past month.  Also checking 2d echo to look for any residual evidence of pulm HTN.    Code Status: Full Code (must indicate code status--if unknown or must be presumed, indicate so) Family Communication: Spoke with wife at bedside (indicate person spoken with, if applicable, with phone number if by telephone) Disposition Plan: Admit to obs (indicate anticipated LOS)  Time spent: 70 min  Maile Linford M. [facility] Pager [phone]  If 7PM-7AM, please contact night-coverage [email or web address] Password [email or web address] 07/02/2012, 1:48 AM

## 2012-07-02 NOTE — Progress Notes (Signed)
VASCULAR LAB PRELIMINARY  PRELIMINARY  PRELIMINARY  PRELIMINARY  Bilateral lower extremity venous duplex completed.    Preliminary report:  Bilateral:  No evidence of DVT, superficial thrombosis, or Baker's Cyst. Previous DVT appears to have resolved   Chalee Hirota, RVS 07/02/2012, 12:37 PM

## 2012-07-02 NOTE — Progress Notes (Signed)
  Echocardiogram 2D Echocardiogram has been performed.  Russell Sanford FRANCES 07/02/2012, 11:49 AM

## 2012-07-02 NOTE — Progress Notes (Signed)
Pt had 5 beats NSVT --pt is asymptomatic.  Strip posted in chart.  Will continue to monitor. Dierdre Highman, RN

## 2012-07-02 NOTE — ED Notes (Signed)
PT given sandwich and cup of water with permission from Dr. Nicanor Alcon.

## 2012-07-02 NOTE — Consult Note (Signed)
CARDIOLOGY CONSULT NOTE  Patient ID: Russell Sanford, MRN: [id number], DOB/AGE: Sep 17, 1955 57 y.o. Admit date: 07/01/2012   Date of Consult: 07/02/2012 Primary Physician: Carollee Herter, MD Primary Cardiologist: Eden Emms  Chief Complaint: SOB, anxiety, chest pressure Reason for Consult: SOB/chest pressure, CAD seen on CT, recent PE 05/2012 but interval improvement on CTA this admission  HPI: Russell Sanford is a 57 y/o M with history of GERD, Barrett's esophagus, hyperlipidemia and recent DVT/PE witih heavy clot burden following LTKA who presented to [facility] 07/01/12 with complaints of SOB/chest pressure. Since his knee replacement, he has had episodes of anxiety associated with SOB, substernal chest pressure, diaphoresis, and occasional sensation that his heart is going to pound out out of his chest. They come on gradually. They don't always occur daily but can happen up to 3-4x/day. He had to pull over driving a few days ago. He had an episode yesterday evening that lasted >1 hour so came to the ER. He received ASA & 2 SL NTG via EMS with improvement in pain from 10/10 down to 3/10. He reports that he was still feeling SOB/chest pressure at time of triage with intake vitals recorded P 76, BP 119/74, Pox 100% 2L, EKG NSR without acute changes. Subsequent CT angio on admission showed no evidence of acute PE, interval recanalization of the vessels involved with bilateral pulmonary emboli on the prior examination, mild-mod LAD atherosclerosis, no evidence of right heart strain, and no acute cardiopulm disease. Troponins neg x 4, glucose mildly elevated, otherwise CBC/CMET WNL. No vomiting, LEE, orthopnea. He has been steadily increasing activity via bike for his knee and denies any CP/SOB with this activity. He has not had any episodes since he's been on tele on 3W. This has shown NSR with 5 beats NSVT.   He does report a 27lb unintentional weight loss in 2 months. He states food just does not  taste good to him anymore and he has been eating less. Has not had endoscopy in several years for his Barrett's esophagus. Denies any bleeding.   Past Medical History  Diagnosis Date  . HH (hiatus hernia)   . Barrett's esophagus   . Arthritis   . GERD (gastroesophageal reflux disease)   . Hypercholesteremia   . DVT (deep venous thrombosis)     a. Posterior tibial vein DVT 05/2012 following L TKA.  . Pulmonary embolism     a. Large burden bilat PE on CTA 05/2012 following L TKA.      Most Recent Cardiac Studies: Exercise Treadmill Test 05/04/06 INTERPRETATION: Mr. Miklas exercised for 10 minutes according to the Bruce protocol. He achieved a work level of 11.6 metabolic equivalents. He achieved 100% of the maximal age predicted heart rate. The test was  stopped due to achievement of age predicted maximal heart rate. Mr. Shorb had no chest pain, arrhythmia, or significant ST changes with exercise. He has good exercise tolerance. His resting ECG is normal and there was no significant ST segment deviation. His resting blood  pressure was 127/77 and his peak blood pressure was 183/80. CONCLUSION: Negative exercise electrocardiogram.   Surgical History:  Past Surgical History  Procedure Laterality Date  . Knee arthroscopy      bilateral  . Shoulder arthroscopy      bilateral  . Toe amputation      left big toe  . Fracture surgery      left lower leg  . Lasik    . Total knee arthroplasty  05/17/2012  Procedure: TOTAL KNEE ARTHROPLASTY;  Surgeon: Eugenia Mcalpine, MD;  Location: [facility];  Service: Orthopedics;  Laterality: Left;     Home Meds: Prior to Admission medications   Medication Sig Start Date End Date Taking? Authorizing Provider  celecoxib (CELEBREX) 200 MG capsule Take 200 mg by mouth 2 (two) times daily.   Yes Historical Provider, MD  methocarbamol (ROBAXIN) 500 MG tablet Take 500 mg by mouth every 6 (six) hours as needed (For muscle spasms.). 05/20/12  Yes Jamelle Rushing, PA-C    omeprazole (PRILOSEC OTC) 20 MG tablet Take 20 mg by mouth every morning.    Yes Historical Provider, MD  Rivaroxaban (XARELTO) 20 MG TABS Take 1 tablet (20 mg total) by mouth daily. 06/22/12  Yes Nishant Dhungel, MD  rosuvastatin (CRESTOR) 10 MG tablet Take 1 tablet (10 mg total) by mouth at bedtime. 06/10/12  Yes Ronnald Nian, MD  Rivaroxaban (XARELTO) 15 MG TABS tablet Take 1 tablet (15 mg total) by mouth 2 (two) times daily. 05/31/12 06/21/12  Theda Belfast Dhungel, MD    Inpatient Medications:  . aspirin  81 mg Oral Daily  . atorvastatin  20 mg Oral q1800  . celecoxib  200 mg Oral BID  . pantoprazole  40 mg Oral q morning - 10a  . Rivaroxaban  20 mg Oral Q supper  . sodium chloride  3 mL Intravenous Q12H    Allergies:  Allergies  Allergen Reactions  . Sulfonamide Derivatives Rash    History   Social History  . Marital Status: Married    Spouse Name: N/A    Number of Children: N/A  . Years of Education: N/A   Occupational History  .      President of [employer]   Social History Main Topics  . Smoking status: Never Smoker   . Smokeless tobacco: Never Used  . Alcohol Use: No     Comment: used to drink heavily, quit January 2014  . Drug Use: No  . Sexually Active: Yes   Other Topics Concern  . Not on file   Social History Narrative  . No narrative on file     Family History  Problem Relation Age of Onset  . Heart attack Father 43    Father died at age 57 of massive MI  . Arrhythmia Mother     Scarlet fever -> "fibrillation"     Review of Systems: General: negative for chills, fever, night sweats. +27lb weight loss since January  Cardiovascular: see above.  Dermatological: negative for rash Respiratory: negative for cough or wheezing Urologic: negative for hematuria Abdominal: negative for nausea, vomiting, diarrhea, bright red blood per rectum, melena, or hematemesis Neurologic: negative for visual changes, syncope, + dizziness All other systems  reviewed and are otherwise negative except as noted above.  Labs:  Recent Labs  07/01/12 2049 07/02/12 0200 07/02/12 0817 07/02/12 1333  TROPONINI <0.30 <0.30 <0.30 <0.30   Lab Results  Component Value Date   WBC 7.0 07/02/2012   HGB 14.1 07/02/2012   HCT 39.3 07/02/2012   MCV 88.7 07/02/2012   PLT 194 07/02/2012    Recent Labs Lab 07/01/12 2048 07/02/12 0201  NA 139 139  K 3.9 3.5  CL 104 104  CO2 25 23  BUN 10 9  CREATININE 0.77 0.75  CALCIUM 9.5 9.6  PROT 7.2  --   BILITOT 0.4  --   ALKPHOS 69  --   ALT 22  --   AST 21  --  GLUCOSE 123* 110*   Lab Results  Component Value Date   CHOL 156 03/13/2012   HDL 48 03/13/2012   LDLCALC 48 03/13/2012   TRIG 298* 03/13/2012   Radiology/Studies:  Ct Angio Chest Pe W/cm &/or Wo Cm 07/02/2012  *RADIOLOGY REPORT*  Clinical Data: Shortness of breath.  Prior history of pulmonary embolism.  CT ANGIOGRAPHY CHEST  Technique:  Multidetector CT imaging of the chest using the standard protocol during bolus administration of intravenous contrast. Multiplanar reconstructed images including MIPs were obtained and reviewed to evaluate the vascular anatomy.  Contrast: OMNIPAQUE IOHEXOL 350 MG/ML SOLN  Comparison: CTA chest 05/29/2012.  Findings: Interval recanalization of the vessels that were involved with the bilateral pulmonary emboli previously.  No filling defects within either main pulmonary artery or their branches in this time to suggest recurrent acute pulmonary embolism.  No visible pulmonary arterial branch stenoses.  Normal heart size.  No evidence of right heart strain.  Mild to moderate LAD coronary atherosclerosis.  No visible atherosclerosis involving the thoracic or upper abdominal aorta or their visualized branches.  No pericardial effusion.  Pulmonary parenchyma clear without evidence of localized airspace consolidation, interstitial disease, or parenchymal nodules or masses.  No pleural effusions.  Central airways patent  without significant bronchial wall thickening.  No significant mediastinal, hilar, or axillary lymphadenopathy. Thyroid gland unremarkable.  Visualized upper abdomen unremarkable for the early arterial phase of enhancement.  Bone window images demonstrate mid thoracic spondylosis.  IMPRESSION:  1.  No evidence of acute pulmonary embolism. 2.  Interval recanalization of the vessels involved with bilateral pulmonary emboli on the prior examination 1 month ago. 3.  No acute cardiopulmonary disease. 4.  Mild to moderate LAD coronary atherosclerosis.  These results were called by telephone on 07/02/2012 at 0035 hours to Dr. Ranae Palms of the emergency department, who verbally acknowledged these results.   Original Report Authenticated By: Hulan Saas, M.D.    Dg Chest Port 1 View 07/01/2012  *RADIOLOGY REPORT*  Clinical Data: Shortness of breath and dizziness tonight.  PORTABLE CHEST - 1 VIEW  Comparison: 05/29/2012  Findings: Minimal interstitial changes in the lungs suggesting slight fibrosis. The heart size and pulmonary vascularity are normal. The lungs appear clear and expanded without focal air space disease or consolidation. No blunting of the costophrenic angles. No pneumothorax mediastinal contours appear intact.  IMPRESSION: No evidence of active pulmonary disease.  Slight fibrosis in the lung bases.   Original Report Authenticated By: Burman Nieves, M.D.     EKG: Admission: NSR 85bpm 1st degree AVB, no acute ST-T changes Today: NSR 69bpm no acute ST-T Changes no sig change from prior  Physical Exam: Blood pressure 122/77, pulse 83, temperature 97.7 F (36.5 C), temperature source Oral, resp. rate 16, height 6\' 1"  (1.854 m), weight 250 lb 1.6 oz (113.445 kg), SpO2 98.00%. General: Well developed, well nourished WM in no acute distress. Head: Normocephalic, atraumatic, sclera non-icteric, no xanthomas, nares are without discharge.  Neck: Negative for carotid bruits. JVD not elevated. Lungs:  Clear bilaterally to auscultation without wheezes, rales, or rhonchi. Breathing is unlabored. Heart: RRR with S1 S2. No murmurs, rubs, or gallops appreciated. Abdomen: Soft, non-tender, non-distended with normoactive bowel sounds. No hepatomegaly. No rebound/guarding. No obvious abdominal masses. Msk:  Strength and tone appear normal for age. Extremities: No clubbing or cyanosis. No edema.  Distal pedal pulses are 2+ and equal bilaterally. Neuro: Alert and oriented X 3. No facial asymmetry. No focal deficit. Moves all extremities spontaneously. Psych:  Responds to questions appropriately with a normal affect.   Assessment and Plan:  1. Chest pressure/SOB with CAD by CT angio - symptoms concerning for unstable angina but no objective evidence of ischemia thus far. He was in NSR at time of arrival when he was still having symptoms. Will plan for inpatient Lexiscan Myoview in AM to risk stratify. Continue ASA, statin. Check lipids. Start Lopressor 25mg  BID given coronary disease. 2. Recent PE/DVT after knee surgery - continue Xarelto. Improving by CT. No right heart strain or CHF. 3. NSVT - add Mag to AM labs. Follow K. Follow on tele. Start lopressor. 4. Unintentional weight loss - in light of #5, would recommend further f/u with GI/PCP to assess.  5. Barrett's esophagus - see above. 6. Hyperglycemia - check A1C.  Signed, Ronie Spies PA-C 07/02/2012, 4:54 PM  Patient seen with PA, agree with above note.   Chest pain, atypical.  It has been present on and off since his PE.  It is not exertional.  He does have coronary artery calcification on CT.  He is anxious.  He had a run of 5 beats NSVT last night.  - Lexiscan myoview in the morning.  - Continue statin, Xarelto. - With run NSVT, add metoprolol 25 mg bid.   Marca Ancona 07/02/2012 5:32 PM

## 2012-07-02 NOTE — Progress Notes (Signed)
  Patient admitted earlier this morning. H&P reviewed.  Denies any chest pain currently. Pain had resolved last night with NTG. Denies any shortness of breath.  Examination is benign.  Lungs are clear. Heart: S1S2 is normal regular. No S3 S4. No rubs murmurs or bruits. Abdomen: Soft Minimal edema LLE  Repeat EKG doesn't show any ischemic changes. Reassuringly CT and dopplers show improving clot burden. ECHO report is pending. CT does suggest coronary atherosclerosis.  He appears to be ruling out. Continue with Aspirin for now. Xarelto for recent PE/DVT. Resume diet as unlikely he will have a stress test today.   Patient and his wife are insisting on cardiology consultation. Patient was seen preoperatively by Dr. Eden Emms. Will consult.  Russell Sanford Surgeon 07/02/2012 1:36 PM

## 2012-07-03 ENCOUNTER — Observation Stay (HOSPITAL_COMMUNITY): Payer: BC Managed Care – PPO

## 2012-07-03 ENCOUNTER — Encounter (HOSPITAL_COMMUNITY): Payer: BC Managed Care – PPO

## 2012-07-03 DIAGNOSIS — I4729 Other ventricular tachycardia: Secondary | ICD-10-CM | POA: Clinically undetermined

## 2012-07-03 DIAGNOSIS — R079 Chest pain, unspecified: Secondary | ICD-10-CM

## 2012-07-03 DIAGNOSIS — E785 Hyperlipidemia, unspecified: Secondary | ICD-10-CM

## 2012-07-03 LAB — BASIC METABOLIC PANEL
GFR calc Af Amer: >90 mL/min (ref >90)
GFR calc non Af Amer: >90 mL/min (ref >90)
Potassium: 3.4 mEq/L — ABNORMAL LOW (ref 3.5–5.1)
Sodium: 139 mEq/L (ref 135–145)

## 2012-07-03 LAB — LIPID PANEL
HDL: 42 mg/dL (ref >39)
LDL Cholesterol: 62 mg/dL (ref 0–99)
Total CHOL/HDL Ratio: 2.9 RATIO
Triglycerides: 87 mg/dL (ref <150)

## 2012-07-03 LAB — HEMOGLOBIN A1C
Hgb A1c MFr Bld: 5.3 %
Mean Plasma Glucose: 105 mg/dL

## 2012-07-03 MED ORDER — TECHNETIUM TC 99M SESTAMIBI - CARDIOLITE
30.0000 | Freq: Once | INTRAVENOUS | Status: AC | PRN
  Administered 2012-07-03: 30 via INTRAVENOUS

## 2012-07-03 MED ORDER — ALPRAZOLAM 0.25 MG PO TABS: 0.2500 mg | ORAL_TABLET | Freq: Every evening | ORAL | Status: DC | PRN

## 2012-07-03 MED ORDER — ZOLPIDEM TARTRATE 5 MG PO TABS
5.0000 mg | ORAL_TABLET | Freq: Once | ORAL | Status: AC
  Administered 2012-07-03: 5 mg via ORAL

## 2012-07-03 MED ORDER — TECHNETIUM TC 99M SESTAMIBI GENERIC - CARDIOLITE
10.0000 | Freq: Once | INTRAVENOUS | Status: AC | PRN
  Administered 2012-07-03: 10 via INTRAVENOUS

## 2012-07-03 MED ORDER — ROSUVASTATIN CALCIUM 10 MG PO TABS: 20.0000 mg | ORAL_TABLET | Freq: Every day | ORAL | Status: DC

## 2012-07-03 MED ORDER — ACETAMINOPHEN 325 MG PO TABS
650.0000 mg | ORAL_TABLET | ORAL | Status: DC | PRN
  Administered 2012-07-03: 650 mg via ORAL

## 2012-07-03 MED ORDER — METOPROLOL TARTRATE 25 MG PO TABS: 25.0000 mg | ORAL_TABLET | Freq: Two times a day (BID) | ORAL | Status: DC

## 2012-07-03 MED ORDER — POTASSIUM CHLORIDE CRYS ER 20 MEQ PO TBCR
40.0000 meq | EXTENDED_RELEASE_TABLET | Freq: Once | ORAL | Status: AC
  Administered 2012-07-03: 40 meq via ORAL
  Filled 2012-07-03: qty 2

## 2012-07-03 MED ORDER — ZOLPIDEM TARTRATE 5 MG PO TABS: 10.0000 mg | ORAL_TABLET | Freq: Every evening | ORAL | Status: DC | PRN

## 2012-07-03 NOTE — Progress Notes (Signed)
Patient: Russell Sanford / Admit Date: 07/01/2012 / Date of Encounter: 07/03/2012, 11:23 AM   Subjective  Had an episode of anxiety/SOB last night around 8pm. I am unable to review tele personally while down in nuc lab, but spoke with centralized telemetry over the phone who deny any arrhythmias at that time. He has had 1 PAC and had an elevated HR of about 118 (sinus) around 2:55am, otherwise nothing.No CP/SOB this AM.   Objective   Telemetry: see above Physical Exam: Filed Vitals:   07/03/12 1121  BP: 135/81  Pulse: 83  Temp:   Resp: 14   General: Well developed, well nourished, in no acute distress. Head: Normocephalic, atraumatic, sclera non-icteric, no xanthomas, nares are without discharge. Neck: Negative for carotid bruits. JVD not elevated. Lungs: Clear bilaterally to auscultation without wheezes, rales, or rhonchi. Breathing is unlabored. Heart: RRR S1 S2 without murmurs, rubs, or gallops.  Abdomen: Soft, non-tender, non-distended with normoactive bowel sounds. No hepatomegaly. No rebound/guarding. No obvious abdominal masses. Msk:  Strength and tone appear normal for age. Extremities: No clubbing or cyanosis. No edema.  Distal pedal pulses are 2+ and equal bilaterally. Neuro: Alert and oriented X 3. Moves all extremities spontaneously. Psych:  Responds to questions appropriately with a normal affect.    Intake/Output Summary (Last 24 hours) at 07/03/12 1123 Last data filed at 07/03/12 0700  Gross per 24 hour  Intake 2342.5 ml  Output   1800 ml  Net  542.5 ml    Inpatient Medications:  . aspirin  81 mg Oral Daily  . atorvastatin  20 mg Oral q1800  . celecoxib  200 mg Oral BID  . metoprolol tartrate  25 mg Oral BID  . pantoprazole  40 mg Oral q morning - 10a  . Rivaroxaban  20 mg Oral Q supper  . sodium chloride  3 mL Intravenous Q12H    Labs:  Recent Labs  07/02/12 0201 07/03/12 0430  NA 139 139  K 3.5 3.4*  CL 104 106  CO2 23 22  GLUCOSE 110* 107*   BUN 9 9  CREATININE 0.75 0.73  CALCIUM 9.6 9.4  MG  --  2.1    Recent Labs  07/01/12 2048  AST 21  ALT 22  ALKPHOS 69  BILITOT 0.4  PROT 7.2  ALBUMIN 4.2    Recent Labs  07/01/12 2048 07/02/12 0201  WBC 7.2 7.0  NEUTROABS 4.8  --   HGB 14.9 14.1  HCT 41.2 39.3  MCV 88.6 88.7  PLT 203 194    Recent Labs  07/01/12 2049 07/02/12 0200 07/02/12 0817 07/02/12 1333  TROPONINI <0.30 <0.30 <0.30 <0.30   No components found with this basename: POCBNP,  No results found for this basename: HGBA1C,  in the last 72 hours  Recent Labs  07/03/12 0430  CHOL 121  HDL 42  LDLCALC 62  TRIG 87  CHOLHDL 2.9    Radiology/Studies:  Ct Angio Chest Pe W/cm &/or Wo Cm  07/02/2012  *RADIOLOGY REPORT*  Clinical Data: Shortness of breath.  Prior history of pulmonary embolism.  CT ANGIOGRAPHY CHEST  Technique:  Multidetector CT imaging of the chest using the standard protocol during bolus administration of intravenous contrast. Multiplanar reconstructed images including MIPs were obtained and reviewed to evaluate the vascular anatomy.  Contrast: OMNIPAQUE IOHEXOL 350 MG/ML SOLN  Comparison: CTA chest 05/29/2012.  Findings: Interval recanalization of the vessels that were involved with the bilateral pulmonary emboli previously.  No filling defects within  either main pulmonary artery or their branches in this time to suggest recurrent acute pulmonary embolism.  No visible pulmonary arterial branch stenoses.  Normal heart size.  No evidence of right heart strain.  Mild to moderate LAD coronary atherosclerosis.  No visible atherosclerosis involving the thoracic or upper abdominal aorta or their visualized branches.  No pericardial effusion.  Pulmonary parenchyma clear without evidence of localized airspace consolidation, interstitial disease, or parenchymal nodules or masses.  No pleural effusions.  Central airways patent without significant bronchial wall thickening.  No significant  mediastinal, hilar, or axillary lymphadenopathy. Thyroid gland unremarkable.  Visualized upper abdomen unremarkable for the early arterial phase of enhancement.  Bone window images demonstrate mid thoracic spondylosis.  IMPRESSION:  1.  No evidence of acute pulmonary embolism. 2.  Interval recanalization of the vessels involved with bilateral pulmonary emboli on the prior examination 1 month ago. 3.  No acute cardiopulmonary disease. 4.  Mild to moderate LAD coronary atherosclerosis.  These results were called by telephone on 07/02/2012 at 0035 hours to Dr. Ranae Palms of the emergency department, who verbally acknowledged these results.   Original Report Authenticated By: Hulan Saas, M.D.    Dg Chest Port 1 View  07/01/2012  *RADIOLOGY REPORT*  Clinical Data: Shortness of breath and dizziness tonight.  PORTABLE CHEST - 1 VIEW  Comparison: 05/29/2012  Findings: Minimal interstitial changes in the lungs suggesting slight fibrosis. The heart size and pulmonary vascularity are normal. The lungs appear clear and expanded without focal air space disease or consolidation. No blunting of the costophrenic angles. No pneumothorax mediastinal contours appear intact.  IMPRESSION: No evidence of active pulmonary disease.  Slight fibrosis in the lung bases.   Original Report Authenticated By: Burman Nieves, M.D.      Assessment and Plan  1. Chest pressure/SOB with CAD by CT angio - symptoms concerning for unstable angina but no objective evidence of ischemia thus far. He was in NSR at time of arrival when he was still having symptoms. Continue ASA, statin. Lopressor started last night due to CAD. Await Lexiscan myoview results. 2. Recent PE/DVT after knee surgery - continue Xarelto. Improving by CT. No right heart strain or CHF.  3. NSVT - no further on tele per remote tele tech. Follow on tele. Lopressor started. Will give KCl today. Might need low dose regular K. 4. Unintentional weight loss - in light of  #5, would recommend further f/u with GI/PCP to assess.  5. Barrett's esophagus - see above.  6. Hyperglycemia - A1C pending.  Signed, Ronie Spies PA-C  The patient is clinically stable. We're awaiting the reading of the nuclear scan.  Jerral Bonito, MD

## 2012-07-03 NOTE — Discharge Summary (Signed)
Physician Discharge Summary  Russell Sanford ZOX:[id number] DOB: 24-Oct-1955 DOA: 07/01/2012  PCP: Carollee Herter, MD  Admit date: 07/01/2012 Discharge date: 07/03/2012  Time spent: 32 minutes  Recommendations for Outpatient Follow-up:  1. Follow wup cardiology as recommended 2. Follow up with PCP on Monday.   Discharge Diagnoses:  Principal Problem:   Chest pain Active Problems:   Pulmonary embolism, bilateral   Nonsustained ventricular tachycardia   Discharge Condition: fair  Diet recommendation: regular diet  Filed Weights   07/02/12 0323  Weight: 113.445 kg (250 lb 1.6 oz)    History of present illness:  Russell Sanford is a 57 y.o. male who presents with complaint of chest pain. Symptoms onset at 8pm after having anxiety, associated with 10/10 severity pain and shortness of breath similar to prior PE he had last month. Located mid sternally, pressure like qualtiy, relieved with NTG SL. Patient had PE last month (provoked by L TKA) and is on Xaralto, has LLE swelling as well worse in the past 2 days.  In the ED CTA of his chest demonstrated no new acute PE, and apparent resolution of much of the prior heavy clot burden on his CTA last month. Hospitalist has been asked to admit for CP r/o cardiac.   Hospital Course:  1. Chest pain - probably from GERD/ anxiety attack. He will be discharged on xanax. Cardiac enzymes negative. Stress test negative for inducible ischemia. He had an episode of SVT and was started on B blockers. And will be discharged on  the same. Echo within normal limits. Resume aspirin along with b blocker and static.  H/o B PE - continue xaralto, ultrasound his LLE shows no further DVT .  Hypokalemia; repleted, recommend checking K on Monday at PCP office.   H/o DM hgba1c is 5.3.  Baretts esophagus; as per the family at bedside, he had repeat EGD and was told the Barrett's has resolved. Resume prilosec and outpatient follow up .      Procedures: Myoview Scan: Normal myocardial perfusion study, with ejection fraction calculated at 64%.      Consultations:  cardiology  Discharge Exam: Filed Vitals:   07/03/12 1117 07/03/12 1119 07/03/12 1121 07/03/12 1400  BP: 131/87 137/84 135/81 119/72  Pulse:    68  Temp:    97.9 F (36.6 C)  TempSrc:    Oral  Resp:    20  Height:      Weight:      SpO2:    97%    Cardiovascular: RRR w/o MRG  Respiratory: CTA B  Abdomen: soft, nt, nd, bs+  Skin: no rash nor lesion, surgical wound appears healed  Musculoskeletal: MAE, full ROM all 4 extremities, some LLE swelling compared to R  Psychiatric: normal tone and affect  Neurologic: AAOx3, grossly non-focal   Discharge Instructions  Discharge Orders   Future Appointments Provider Department Dept Phone   07/08/2012 9:15 AM Ronnald Nian, MD [facility] [phone]   Future Orders Complete By Expires     Activity as tolerated - No restrictions  As directed     Discharge instructions  As directed     Comments:      Follow up with [facility] as recommended.        Medication List    TAKE these medications       ALPRAZolam 0.25 MG tablet  Commonly known as:  XANAX  Take 1 tablet (0.25 mg total) by mouth at bedtime as needed for  sleep.     celecoxib 200 MG capsule  Commonly known as:  CELEBREX  Take 200 mg by mouth 2 (two) times daily.     methocarbamol 500 MG tablet  Commonly known as:  ROBAXIN  Take 500 mg by mouth every 6 (six) hours as needed (For muscle spasms.).     metoprolol tartrate 25 MG tablet  Commonly known as:  LOPRESSOR  Take 1 tablet (25 mg total) by mouth 2 (two) times daily.     omeprazole 20 MG tablet  Commonly known as:  PRILOSEC OTC  Take 20 mg by mouth every morning.     Rivaroxaban 15 MG Tabs tablet  Commonly known as:  XARELTO  Take 1 tablet (15 mg total) by mouth 2 (two) times daily.     Rivaroxaban 20 MG Tabs  Commonly known as:  XARELTO  Take  1 tablet (20 mg total) by mouth daily.     rosuvastatin 10 MG tablet  Commonly known as:  CRESTOR  Take 2 tablets (20 mg total) by mouth at bedtime.           Follow-up Information   Follow up with Carollee Herter, MD. Schedule an appointment as soon as possible for a visit in 3 days.   Contact information:   [address] [place] [address] [phone]       Follow up with [facility]. (Our office will call you with your follow-up appointment for about 6 weeks. Call our office if you haven't heard from [place] within 1 week.)    Contact information:   [address] [place] [address] [phone]       The results of significant diagnostics from this hospitalization (including imaging, microbiology, ancillary and laboratory) are listed below for reference.    Significant Diagnostic Studies: Ct Angio Chest Pe W/cm &/or Wo Cm  07/02/2012  *RADIOLOGY REPORT*  Clinical Data: Shortness of breath.  Prior history of pulmonary embolism.  CT ANGIOGRAPHY CHEST  Technique:  Multidetector CT imaging of the chest using the standard protocol during bolus administration of intravenous contrast. Multiplanar reconstructed images including MIPs were obtained and reviewed to evaluate the vascular anatomy.  Contrast: OMNIPAQUE IOHEXOL 350 MG/ML SOLN  Comparison: CTA chest 05/29/2012.  Findings: Interval recanalization of the vessels that were involved with the bilateral pulmonary emboli previously.  No filling defects within either main pulmonary artery or their branches in this time to suggest recurrent acute pulmonary embolism.  No visible pulmonary arterial branch stenoses.  Normal heart size.  No evidence of right heart strain.  Mild to moderate LAD coronary atherosclerosis.  No visible atherosclerosis involving the thoracic or upper abdominal aorta or their visualized branches.  No pericardial effusion.  Pulmonary parenchyma clear without evidence of  localized airspace consolidation, interstitial disease, or parenchymal nodules or masses.  No pleural effusions.  Central airways patent without significant bronchial wall thickening.  No significant mediastinal, hilar, or axillary lymphadenopathy. Thyroid gland unremarkable.  Visualized upper abdomen unremarkable for the early arterial phase of enhancement.  Bone window images demonstrate mid thoracic spondylosis.  IMPRESSION:  1.  No evidence of acute pulmonary embolism. 2.  Interval recanalization of the vessels involved with bilateral pulmonary emboli on the prior examination 1 month ago. 3.  No acute cardiopulmonary disease. 4.  Mild to moderate LAD coronary atherosclerosis.  These results were called by telephone on 07/02/2012 at 0035 hours to Dr. Ranae Palms of the emergency department, who verbally acknowledged these results.  Original Report Authenticated By: Hulan Saas, M.D.    Nm Myocar Multi W/spect W/wall Motion / Ef  07/03/2012  Clinical Data:  Chest pain.  Coronary artery calcifications. Family history of heart disease.  Hypercholesterolemia.  Technique:  Standard myocardial SPECT imaging performed after resting intravenous injection of Tc-80m tetrofosmin.  Subsequently, stress in the form of Lexiscan was administered under the supervision of the Cardiology staff.  At peak stress, Tc-3m tetrofosmin was injected intravenously and standard myocardial SPECT imaging performed.  Quantitative gated imaging also performed to evaluate left ventricular wall motion and estimate left ventricular ejection fraction.  Radiopharmaceutical: Tc-73m tetrofosmin, 10 mCi at rest and 30 mCi at stress.  Comparison:  Chest CT dated 07/01/2012  MYOCARDIAL IMAGING WITH SPECT (REST AND STRESS)  Findings:  No significant perfusion defect is identified to suggest scar or inducible ischemia.  LEFT VENTRICULAR EJECTION FRACTION  Findings:  Left ventricular end-diastolic volume is 119 cc.  End- systolic volume is 43 cc.   Derived LV ejection fraction is 64%.  GATED LEFT VENTRICULAR WALL MOTION STUDY  Findings:  Left ventricular wall motion and wall thickening appear normal.  IMPRESSION:  1.  Normal myocardial perfusion study, with ejection fraction calculated at 64%.   Original Report Authenticated By: Gaylyn Rong, M.D.    Dg Chest Port 1 View  07/01/2012  *RADIOLOGY REPORT*  Clinical Data: Shortness of breath and dizziness tonight.  PORTABLE CHEST - 1 VIEW  Comparison: 05/29/2012  Findings: Minimal interstitial changes in the lungs suggesting slight fibrosis. The heart size and pulmonary vascularity are normal. The lungs appear clear and expanded without focal air space disease or consolidation. No blunting of the costophrenic angles. No pneumothorax mediastinal contours appear intact.  IMPRESSION: No evidence of active pulmonary disease.  Slight fibrosis in the lung bases.   Original Report Authenticated By: Burman Nieves, M.D.     Microbiology: No results found for this or any previous visit (from the past 240 hour(s)).   Labs: Basic Metabolic Panel:  Recent Labs Lab 07/01/12 2048 07/02/12 0201 07/03/12 0430  NA 139 139 139  K 3.9 3.5 3.4*  CL 104 104 106  CO2 25 23 22   GLUCOSE 123* 110* 107*  BUN 10 9 9   CREATININE 0.77 0.75 0.73  CALCIUM 9.5 9.6 9.4  MG  --   --  2.1   Liver Function Tests:  Recent Labs Lab 07/01/12 2048  AST 21  ALT 22  ALKPHOS 69  BILITOT 0.4  PROT 7.2  ALBUMIN 4.2   No results found for this basename: LIPASE, AMYLASE,  in the last 168 hours No results found for this basename: AMMONIA,  in the last 168 hours CBC:  Recent Labs Lab 07/01/12 2048 07/02/12 0201  WBC 7.2 7.0  NEUTROABS 4.8  --   HGB 14.9 14.1  HCT 41.2 39.3  MCV 88.6 88.7  PLT 203 194   Cardiac Enzymes:  Recent Labs Lab 07/01/12 2049 07/02/12 0200 07/02/12 0817 07/02/12 1333  TROPONINI <0.30 <0.30 <0.30 <0.30   BNP: BNP (last 3 results) No results found for this basename:  PROBNP,  in the last 8760 hours CBG: No results found for this basename: GLUCAP,  in the last 168 hours     Signed:  Terrell Ostrand  [facility] 07/03/2012, 4:29 PM

## 2012-07-03 NOTE — Progress Notes (Addendum)
Nuc and echo results were normal. D/w Dr. Myrtis Ser. OK for DC from cardiac standpoint. We will schedule f/u appt for 6 weeks to see in clinic (given CAD on CT, general f/u); our office will call him with this appt. Dayna Dunn PA-C  Jerral Bonito, MD

## 2012-07-03 NOTE — Progress Notes (Signed)
Pt provided with dc instructions and education. Pt verbalized understanding. Pt handed prescriptions and verbalized how to take new meds. IV removed with tip intact. Heart monitor cleaned and returned to front. Levonne Spiller, RN

## 2012-07-08 ENCOUNTER — Encounter: Payer: Self-pay | Admitting: Family Medicine

## 2012-07-08 ENCOUNTER — Ambulatory Visit (INDEPENDENT_AMBULATORY_CARE_PROVIDER_SITE_OTHER): Payer: BC Managed Care – PPO | Admitting: Family Medicine

## 2012-07-08 VITALS — BP 120/80 | HR 86 | Wt 252.0 lb

## 2012-07-08 DIAGNOSIS — Z96659 Presence of unspecified artificial knee joint: Secondary | ICD-10-CM

## 2012-07-08 DIAGNOSIS — G4701 Insomnia due to medical condition: Secondary | ICD-10-CM

## 2012-07-08 DIAGNOSIS — Z8679 Personal history of other diseases of the circulatory system: Secondary | ICD-10-CM | POA: Insufficient documentation

## 2012-07-08 DIAGNOSIS — R5381 Other malaise: Secondary | ICD-10-CM

## 2012-07-08 DIAGNOSIS — R5383 Other fatigue: Secondary | ICD-10-CM

## 2012-07-08 DIAGNOSIS — Z96652 Presence of left artificial knee joint: Secondary | ICD-10-CM

## 2012-07-08 DIAGNOSIS — Z86711 Personal history of pulmonary embolism: Secondary | ICD-10-CM

## 2012-07-08 MED ORDER — ESZOPICLONE 3 MG PO TABS: 3.0000 mg | ORAL_TABLET | Freq: Every day | ORAL | Status: DC

## 2012-07-08 NOTE — Patient Instructions (Signed)
Stop the sleep medication and the Xanax. Try the Lunesta. Call me at the end of the week concerning her sleep I will check with the cardiologist concerning the Lopressor.

## 2012-07-08 NOTE — Progress Notes (Signed)
  Subjective:    Patient ID: Russell Sanford, male    DOB: May 03, 1955, 57 y.o.   MRN: [id number]  HPI He is here for followup visit. He has had a rough time lately. He was admitted to the hospital again and evaluated for chest pain. He was found to have ventricular tachycardia. The followup CT A was negative for repeat pulmonary embolus. At this time he notes difficulty eating. He states that this started when he was placed on Xarelto. He does have a 25 pound weight loss. He also is noted a decrease in his energy level especially since she was started on the Lopressor. He complains of continued difficulty with sleep. He has tried Xanax taking as much is 1.5 mg without much success. He tried Ambien in the hospital and found it to not be useful. He does continue to have left shoulder pain which is interfering with his sleep as well. He does complain of being depressed over all the problems that he has had since his had his total knee replacement.   Review of Systems     Objective:   Physical Exam Alert and in no distress and somewhat despondent appearing       Assessment & Plan:  Fatigue - Plan: TSH, Testosterone  Insomnia due to medical condition - Plan: Eszopiclone (ESZOPICLONE) 3 MG TABS, DISCONTINUED: Eszopiclone (ESZOPICLONE) 3 MG TABS  History of pulmonary embolus (PE)  S/P total knee arthroplasty, left  History of ventricular tachycardia over 30 minutes spent discussing all these issues with him. I explained that the beta blocker could indeed be causing some of his decreased energy. Other factors are deconditioning that occurred due to his hospitalizations and work stress her in We discussed antidepressants however he would like to try sleep medication to see if this will help. I will give him Lunesta to see if this will benefit him. I will also discuss the anorexia in relation to [facility] with his cardiologist. Maryclare Labrador also discussed possibly switching to a different beta blocker.

## 2012-07-09 ENCOUNTER — Other Ambulatory Visit: Payer: Self-pay | Admitting: Family Medicine

## 2012-07-09 MED ORDER — NEBIVOLOL HCL 5 MG PO TABS: 5.0000 mg | ORAL_TABLET | Freq: Every day | ORAL | Status: DC

## 2012-07-09 MED ORDER — RIVAROXABAN 20 MG PO TABS: 20.0000 mg | ORAL_TABLET | Freq: Every day | ORAL | Status: DC

## 2012-07-09 NOTE — Progress Notes (Signed)
His case was discussed with Dr. Eden Emms.I will have him stop Lopressor and switch to Bystolic. Also discussed the anorexia in regard to his arrival toe. We will keep him on that medication. I explained to the patient that the relationship between the medicine and anorexia has not been seen by either of [place]. He will continue on his Lunesta to see if it will help with his sleep.

## 2012-07-22 ENCOUNTER — Telehealth: Payer: Self-pay | Admitting: Family Medicine

## 2012-07-22 ENCOUNTER — Telehealth: Payer: Self-pay

## 2012-07-22 ENCOUNTER — Other Ambulatory Visit: Payer: Self-pay

## 2012-07-22 MED ORDER — DOXEPIN HCL 50 MG PO CAPS: 50.0000 mg | ORAL_CAPSULE | Freq: Every day | ORAL | Status: DC

## 2012-07-22 MED ORDER — CELECOXIB 200 MG PO CAPS: 200.0000 mg | ORAL_CAPSULE | Freq: Two times a day (BID) | ORAL | Status: DC

## 2012-07-22 NOTE — Telephone Encounter (Signed)
Let him know that the results got lost in the system but his testosterone level is in the normal range

## 2012-07-22 NOTE — Telephone Encounter (Signed)
Let him know that I call the medication and to try to help with his sleep

## 2012-07-22 NOTE — Telephone Encounter (Deleted)
He has had difficulty getting good sleep with Lunesta. He also failed to respond Ambien. I will try him on doxepin

## 2012-07-22 NOTE — Telephone Encounter (Signed)
PATIENT CALLED AND WANTED RESULTS TO HIS TESTOSTERONE IT LOOKS LIKE YOU HAVE NOT REVIEWED THEM GAVE PATIENT RESULTS 381 LOW SIDE OF NORMAL JUST FOR YOU FYI

## 2012-07-22 NOTE — Telephone Encounter (Signed)
WHAT MED DID YOU CALL IN

## 2012-07-22 NOTE — Telephone Encounter (Signed)
Lynden Ang gave patient 86 and had me put in chart

## 2012-07-22 NOTE — Telephone Encounter (Signed)
PT INFORMED

## 2012-07-22 NOTE — Telephone Encounter (Signed)
Pt called stated the lunesta did not work. Wants to know what next. Pt uses [facility].

## 2012-07-30 ENCOUNTER — Telehealth: Payer: Self-pay | Admitting: Family Medicine

## 2012-07-30 MED ORDER — ATORVASTATIN CALCIUM 20 MG PO TABS: 20.0000 mg | ORAL_TABLET | Freq: Every day | ORAL | Status: DC

## 2012-07-30 NOTE — Telephone Encounter (Signed)
Advised pt his ins WILL NOT pay for Crestor without trial of Lipitor

## 2012-07-30 NOTE — Telephone Encounter (Signed)
Let him know that his insurance will pay for Crestor. See if he has been on any other meds and if not then switch him to 20 mg of Lipitor.and let him know we will need to recheck this in 2 months

## 2012-07-30 NOTE — Telephone Encounter (Signed)
CELEBREX APPROVED

## 2012-07-31 ENCOUNTER — Other Ambulatory Visit: Payer: Self-pay

## 2012-08-12 ENCOUNTER — Encounter: Payer: BC Managed Care – PPO | Admitting: Cardiology

## 2012-08-13 ENCOUNTER — Telehealth: Payer: Self-pay | Admitting: Family Medicine

## 2012-08-13 NOTE — Telephone Encounter (Signed)
I called and discussed the need for him to stop his XARELTO one day prior to surgery and started back one day after the surgery.

## 2012-08-19 ENCOUNTER — Inpatient Hospital Stay (HOSPITAL_COMMUNITY)
Admission: RE | Admit: 2012-08-19 | Discharge: 2012-08-19 | Disposition: A | Discharge status: Home / Self Care | Payer: BC Managed Care – PPO | Source: Ambulatory Visit

## 2012-08-19 NOTE — Pre-Procedure Instructions (Signed)
Russell Sanford  08/19/2012   Your procedure is scheduled on:  08/29/12  Report to [facility] at 630 AM.  Call this number if you have problems the morning of surgery: [phone]   Remember:   Do not eat food or drink liquids after midnight.   Take these medicines the morning of surgery with A SIP OF WATER: xanax,bystolic,prilosec   Do not wear jewelry, make-up or nail polish.  Do not wear lotions, powders, or perfumes. You may wear deodorant.  Do not shave 48 hours prior to surgery. Men may shave face and neck.  Do not bring valuables to the hospital.  Contacts, dentures or bridgework may not be worn into surgery.  Leave suitcase in the car. After surgery it may be brought to your room.  For patients admitted to the hospital, checkout time is 11:00 AM the day of  discharge.   Patients discharged the day of surgery will not be allowed to drive  home.  Name and phone number of your driver: family  Special Instructions: Incentive Spirometry - Practice and bring it with you on the day of surgery.   Please read over the following fact sheets that you were given: Pain Booklet, Coughing and Deep Breathing, Blood Transfusion Information, MRSA Information and Surgical Site Infection Prevention

## 2012-08-20 ENCOUNTER — Encounter: Payer: Self-pay | Admitting: Cardiology

## 2012-08-21 ENCOUNTER — Encounter (HOSPITAL_COMMUNITY): Payer: Self-pay | Admitting: Pharmacist

## 2012-08-22 ENCOUNTER — Encounter (HOSPITAL_COMMUNITY): Payer: Self-pay

## 2012-08-22 NOTE — Progress Notes (Signed)
Anesthesia chart review: Patient is a 57 year old male scheduled for left total shoulder arthroplasty by Dr. Rennis Chris on 08/29/2012. His PAT visit is scheduled for 08/23/12.  History includes nonsmoker, hypercholesterolemia, GERD, hiatal hernia, Barrett's esophagus, arthritis, left great toe amputation, left TKA 05/17/2012 complicated by left PT DVT and bilateral PE diagnosed on 05/29/12.  Follow-up CTA in March 2014 showed recanalization of the vessels involved in the PE and duplex showed resolution of DVT.  He is currently on Xarelto.  Dr. Sharlot Gowda is his PCP who has cleared patient for this procedure with instructions to stop Xarelto one day prior to surgery and restart one day after his surgery.    He was evaluated by Cardiologist Dr. Willa Rough and Dr. Marca Ancona in March 2014 for evaluation for chest pain, SOB, anxiety, and finding of CAD by CTA.  He had a normal stress and echo.  They did recommend continued cardiology follow-up due to the finding of CAD on CTA, but no acute intervention.  Of note, his primary cardiologist is listed as Dr. Eden Emms.  (Since patient had recent normal stress and echo and was cleared by his PCP, Anesthesiologist Dr. Krista Blue agreed that patient would not necessarily need cardiac clearance if there had been no acute changes.)    EKG on 07/02/12 showed NSR.  Nuclear stress test on 07/03/12 showed: 1. Normal myocardial perfusion study, with ejection fraction calculated at 64%.   Echo on 07/02/12 showed: - Left ventricle: The cavity size was normal. Systolic function was normal. The estimated ejection fraction was in the range of 55% to 60%. Wall motion was normal; there were no regional wall motion abnormalities. Left ventricular diastolic function parameters were normal. - Atrial septum: No defect or patent foramen ovale was identified.  2V CXR on 05/29/12 showed mild enlargement of cardiac silhouette with question slight pulmonary vascular congestion. No acute  infiltrate. 1V CXR on 07/01/12 showed no evidence of pulmonary disease.  Slight fibrosis in the lung bases.  He had CTA on 05/29/12 that confirmed large burden of PE to the lungs bilaterally without evidence of pulmonary hemorrhage, pulmonary infarction, findings to suggest pulmonary arterial hypertension, or right sided heart strain.  Follow-up CTA on 07/02/12 showed: 1. No evidence of acute pulmonary embolism.  2. Interval recanalization of the vessels involved with bilateral pulmonary emboli on the prior examination 1 month ago.  3. No acute cardiopulmonary disease.  4. Mild to moderate LAD coronary atherosclerosis.  BLE venous duplex on 07/02/12 showed resolution of previous LLE DVT and no evidence of DVT or superficial thrombosis or Baker's cyst involving BLE.  Patient is for labs on 08/23/12.  PAT RN can have the anesthesiologist/PA review further if abnormal.  Velna [facility]/Anesthesiology Phone [phone] 08/22/2012 5:36 PM

## 2012-08-23 ENCOUNTER — Encounter (HOSPITAL_COMMUNITY)
Admission: RE | Admit: 2012-08-23 | Discharge: 2012-08-23 | Disposition: A | Discharge status: Home / Self Care | Payer: BC Managed Care – PPO | Source: Ambulatory Visit | Attending: Orthopedic Surgery | Admitting: Orthopedic Surgery

## 2012-08-23 ENCOUNTER — Encounter (HOSPITAL_COMMUNITY): Payer: Self-pay

## 2012-08-23 DIAGNOSIS — Z01811 Encounter for preprocedural respiratory examination: Secondary | ICD-10-CM | POA: Insufficient documentation

## 2012-08-23 DIAGNOSIS — Z01812 Encounter for preprocedural laboratory examination: Secondary | ICD-10-CM | POA: Insufficient documentation

## 2012-08-23 DIAGNOSIS — Z01818 Encounter for other preprocedural examination: Secondary | ICD-10-CM | POA: Insufficient documentation

## 2012-08-23 HISTORY — DX: Cardiac arrhythmia, unspecified: I49.9

## 2012-08-23 LAB — ABO/RH: ABO/RH(D): O POS

## 2012-08-23 LAB — CBC
Hemoglobin: 15.2 g/dL (ref 13.0–17.0)
MCV: 86.2 fL (ref 78.0–100.0)
Platelets: 197 10*3/uL (ref 150–400)
RBC: 4.85 MIL/uL (ref 4.22–5.81)
WBC: 6 10*3/uL (ref 4.0–10.5)

## 2012-08-23 LAB — SURGICAL PCR SCREEN
MRSA, PCR: NEGATIVE
Staphylococcus aureus: NEGATIVE

## 2012-08-23 LAB — PROTIME-INR: Prothrombin Time: 16.1 seconds — ABNORMAL HIGH (ref 11.6–15.2)

## 2012-08-23 LAB — COMPREHENSIVE METABOLIC PANEL
Albumin: 4.7 g/dL (ref 3.5–5.2)
BUN: 11 mg/dL (ref 6–23)
Calcium: 9.3 mg/dL (ref 8.4–10.5)
Chloride: 102 mEq/L (ref 96–112)
Creatinine, Ser: 0.92 mg/dL (ref 0.50–1.35)
Total Bilirubin: 0.5 mg/dL (ref 0.3–1.2)

## 2012-08-23 LAB — TYPE AND SCREEN
ABO/RH(D): O POS
Antibody Screen: NEGATIVE

## 2012-08-23 LAB — APTT: aPTT: 35 seconds (ref 24–37)

## 2012-08-23 NOTE — Pre-Procedure Instructions (Signed)
HANNA AULTMAN  08/23/2012   Your procedure is scheduled on:  Thursday May 15,  Report to [facility] at 0530 AM.  Call this number if you have problems the morning of surgery: [phone]   Remember:   Do not eat food or drink liquids after midnight Wednesday  Take these medicines the morning of surgery with A SIP OF WATER: Omeprazole(Prilosec)    Do not wear jewelry.  Do not wear lotions, or powders. You may wear deodorant.  Do not shave 48 hours prior to surgery. Men may shave face and neck.  Do not bring valuables to the hospital.  Contacts, dentures or bridgework may not be worn into surgery.  Leave suitcase in the car. After surgery it may be brought to your room.  For patients admitted to the hospital, checkout time is 11:00 AM the day of  discharge.   Patients discharged the day of surgery will not be allowed to drive  home.    Special Instructions: Incentive Spirometry - Practice and bring it with you on the day of surgery. Shower using CHG 2 nights before surgery and the night before surgery.  If you shower the day of surgery use CHG.  Use special wash - you have one bottle of CHG for all showers.  You should use approximately 1/3 of the bottle for each shower.   Please read over the following fact sheets that you were given: Pain Booklet, Coughing and Deep Breathing, Blood Transfusion Information, MRSA Information and Surgical Site Infection Prevention

## 2012-08-28 MED ORDER — DEXTROSE 5 % IV SOLN
3.0000 g | INTRAVENOUS | Status: AC
  Administered 2012-08-29: 3 g via INTRAVENOUS
  Filled 2012-08-28: qty 3000

## 2012-08-29 ENCOUNTER — Inpatient Hospital Stay (HOSPITAL_COMMUNITY)
Admission: RE | Admit: 2012-08-29 | Discharge: 2012-08-30 | DRG: 491 | Disposition: A | Discharge status: Home / Self Care | Payer: BC Managed Care – PPO | Source: Ambulatory Visit | Attending: Orthopedic Surgery | Admitting: Orthopedic Surgery

## 2012-08-29 ENCOUNTER — Encounter (HOSPITAL_COMMUNITY): Payer: Self-pay | Admitting: Certified Registered"

## 2012-08-29 ENCOUNTER — Encounter (HOSPITAL_COMMUNITY)
Admission: RE | Disposition: A | Discharge status: Home / Self Care | Payer: Self-pay | Source: Ambulatory Visit | Attending: Orthopedic Surgery

## 2012-08-29 ENCOUNTER — Ambulatory Visit (HOSPITAL_COMMUNITY): Payer: BC Managed Care – PPO | Admitting: Certified Registered"

## 2012-08-29 ENCOUNTER — Encounter (HOSPITAL_COMMUNITY): Payer: Self-pay | Admitting: Vascular Surgery

## 2012-08-29 DIAGNOSIS — E7800 Pure hypercholesterolemia, unspecified: Secondary | ICD-10-CM | POA: Diagnosis present

## 2012-08-29 DIAGNOSIS — K2270 Barrett's esophagus without dysplasia: Secondary | ICD-10-CM | POA: Diagnosis present

## 2012-08-29 DIAGNOSIS — M19019 Primary osteoarthritis, unspecified shoulder: Principal | ICD-10-CM

## 2012-08-29 DIAGNOSIS — Z86718 Personal history of other venous thrombosis and embolism: Secondary | ICD-10-CM

## 2012-08-29 DIAGNOSIS — Z86711 Personal history of pulmonary embolism: Secondary | ICD-10-CM

## 2012-08-29 DIAGNOSIS — K219 Gastro-esophageal reflux disease without esophagitis: Secondary | ICD-10-CM | POA: Diagnosis present

## 2012-08-29 DIAGNOSIS — Z89529 Acquired absence of unspecified knee: Secondary | ICD-10-CM

## 2012-08-29 HISTORY — PX: TOTAL SHOULDER ARTHROPLASTY: SHX126

## 2012-08-29 HISTORY — PX: TOTAL SHOULDER REPLACEMENT: SUR1217

## 2012-08-29 SURGERY — ARTHROPLASTY, SHOULDER, TOTAL
Anesthesia: General | Site: Shoulder | Laterality: Left | Wound class: Clean

## 2012-08-29 MED ORDER — METHOCARBAMOL 500 MG PO TABS
ORAL_TABLET | ORAL | Status: AC
  Administered 2012-08-29: 500 mg
  Filled 2012-08-29: qty 1

## 2012-08-29 MED ORDER — ACETAMINOPHEN 650 MG RE SUPP: 650.0000 mg | Freq: Four times a day (QID) | RECTAL | Status: DC | PRN

## 2012-08-29 MED ORDER — NEBIVOLOL HCL 5 MG PO TABS
5.0000 mg | ORAL_TABLET | Freq: Every day | ORAL | Status: DC
  Administered 2012-08-29: 5 mg via ORAL
  Filled 2012-08-29 (×2): qty 1

## 2012-08-29 MED ORDER — FLEET ENEMA 7-19 GM/118ML RE ENEM: 1.0000 | ENEMA | Freq: Once | RECTAL | Status: AC | PRN

## 2012-08-29 MED ORDER — METOCLOPRAMIDE HCL 10 MG PO TABS: 5.0000 mg | ORAL_TABLET | Freq: Three times a day (TID) | ORAL | Status: DC | PRN

## 2012-08-29 MED ORDER — ROCURONIUM BROMIDE 100 MG/10ML IV SOLN
INTRAVENOUS | Status: DC | PRN
  Administered 2012-08-29: 50 mg via INTRAVENOUS

## 2012-08-29 MED ORDER — NEOSTIGMINE METHYLSULFATE 1 MG/ML IJ SOLN
INTRAMUSCULAR | Status: DC | PRN
  Administered 2012-08-29: 3 mg via INTRAVENOUS

## 2012-08-29 MED ORDER — TEMAZEPAM 15 MG PO CAPS: 15.0000 mg | ORAL_CAPSULE | Freq: Every evening | ORAL | Status: DC | PRN

## 2012-08-29 MED ORDER — MENTHOL 3 MG MT LOZG: 1.0000 | LOZENGE | OROMUCOSAL | Status: DC | PRN

## 2012-08-29 MED ORDER — MIDAZOLAM HCL 5 MG/5ML IJ SOLN
INTRAMUSCULAR | Status: DC | PRN
  Administered 2012-08-29: 2 mg via INTRAVENOUS

## 2012-08-29 MED ORDER — FENTANYL CITRATE 0.05 MG/ML IJ SOLN
INTRAMUSCULAR | Status: AC
  Filled 2012-08-29: qty 2

## 2012-08-29 MED ORDER — LACTATED RINGERS IV SOLN: INTRAVENOUS | Status: DC

## 2012-08-29 MED ORDER — OXYCODONE-ACETAMINOPHEN 5-325 MG PO TABS
1.0000 | ORAL_TABLET | ORAL | Status: DC | PRN
  Administered 2012-08-29 (×3): 2 via ORAL
  Filled 2012-08-29 (×3): qty 2

## 2012-08-29 MED ORDER — OXYCODONE HCL 5 MG/5ML PO SOLN: 5.0000 mg | Freq: Once | ORAL | Status: DC | PRN

## 2012-08-29 MED ORDER — VECURONIUM BROMIDE 10 MG IV SOLR
INTRAVENOUS | Status: DC | PRN
  Administered 2012-08-29 (×2): 2 mg via INTRAVENOUS
  Administered 2012-08-29: 1 mg via INTRAVENOUS

## 2012-08-29 MED ORDER — GLYCOPYRROLATE 0.2 MG/ML IJ SOLN
INTRAMUSCULAR | Status: DC | PRN
  Administered 2012-08-29: 0.4 mg via INTRAVENOUS

## 2012-08-29 MED ORDER — SUFENTANIL CITRATE 50 MCG/ML IV SOLN
INTRAVENOUS | Status: DC | PRN
  Administered 2012-08-29: 20 ug via INTRAVENOUS
  Administered 2012-08-29: 10 ug via INTRAVENOUS

## 2012-08-29 MED ORDER — ONDANSETRON HCL 4 MG/2ML IJ SOLN
INTRAMUSCULAR | Status: DC | PRN
  Administered 2012-08-29: 4 mg via INTRAVENOUS

## 2012-08-29 MED ORDER — RIVAROXABAN 15 MG PO TABS: 15.0000 mg | ORAL_TABLET | Freq: Every day | ORAL | Status: DC

## 2012-08-29 MED ORDER — BUPIVACAINE-EPINEPHRINE PF 0.5-1:200000 % IJ SOLN
INTRAMUSCULAR | Status: DC | PRN
  Administered 2012-08-29: 30 mL

## 2012-08-29 MED ORDER — ONDANSETRON HCL 4 MG/2ML IJ SOLN: 4.0000 mg | Freq: Once | INTRAMUSCULAR | Status: DC | PRN

## 2012-08-29 MED ORDER — PROPOFOL 10 MG/ML IV BOLUS
INTRAVENOUS | Status: DC | PRN
  Administered 2012-08-29: 200 mg via INTRAVENOUS

## 2012-08-29 MED ORDER — METOCLOPRAMIDE HCL 5 MG/ML IJ SOLN: 5.0000 mg | Freq: Three times a day (TID) | INTRAMUSCULAR | Status: DC | PRN

## 2012-08-29 MED ORDER — MIDAZOLAM HCL 2 MG/2ML IJ SOLN
INTRAMUSCULAR | Status: AC
  Filled 2012-08-29: qty 2

## 2012-08-29 MED ORDER — EPHEDRINE SULFATE 50 MG/ML IJ SOLN
INTRAMUSCULAR | Status: DC | PRN
  Administered 2012-08-29 (×2): 10 mg via INTRAVENOUS

## 2012-08-29 MED ORDER — OMEPRAZOLE MAGNESIUM 20 MG PO TBEC: 20.0000 mg | DELAYED_RELEASE_TABLET | Freq: Every morning | ORAL | Status: DC

## 2012-08-29 MED ORDER — MEPERIDINE HCL 25 MG/ML IJ SOLN: 6.2500 mg | INTRAMUSCULAR | Status: DC | PRN

## 2012-08-29 MED ORDER — LACTATED RINGERS IV SOLN
INTRAVENOUS | Status: DC
  Administered 2012-08-29 (×2): via INTRAVENOUS

## 2012-08-29 MED ORDER — OXYCODONE HCL 5 MG PO TABS
ORAL_TABLET | ORAL | Status: AC
  Administered 2012-08-29: 5 mg
  Filled 2012-08-29: qty 1

## 2012-08-29 MED ORDER — DOCUSATE SODIUM 100 MG PO CAPS
100.0000 mg | ORAL_CAPSULE | Freq: Two times a day (BID) | ORAL | Status: DC
  Administered 2012-08-29 – 2012-08-30 (×3): 100 mg via ORAL
  Filled 2012-08-29 (×5): qty 1

## 2012-08-29 MED ORDER — ATORVASTATIN CALCIUM 40 MG PO TABS
40.0000 mg | ORAL_TABLET | Freq: Every day | ORAL | Status: DC
  Administered 2012-08-29: 40 mg via ORAL
  Filled 2012-08-29 (×2): qty 1

## 2012-08-29 MED ORDER — POLYETHYLENE GLYCOL 3350 17 G PO PACK: 17.0000 g | PACK | Freq: Every day | ORAL | Status: DC | PRN

## 2012-08-29 MED ORDER — CHLORHEXIDINE GLUCONATE 4 % EX LIQD: 60.0000 mL | Freq: Once | CUTANEOUS | Status: DC

## 2012-08-29 MED ORDER — METHOCARBAMOL 100 MG/ML IJ SOLN
500.0000 mg | Freq: Four times a day (QID) | INTRAVENOUS | Status: DC | PRN
  Filled 2012-08-29: qty 5

## 2012-08-29 MED ORDER — LIDOCAINE HCL (CARDIAC) 20 MG/ML IV SOLN
INTRAVENOUS | Status: DC | PRN
  Administered 2012-08-29: 100 mg via INTRAVENOUS

## 2012-08-29 MED ORDER — HYDROMORPHONE HCL PF 1 MG/ML IJ SOLN
0.2500 mg | INTRAMUSCULAR | Status: DC | PRN
  Administered 2012-08-29 – 2012-08-30 (×7): 1 mg via INTRAVENOUS
  Filled 2012-08-29 (×7): qty 1

## 2012-08-29 MED ORDER — ACETAMINOPHEN 325 MG PO TABS: 650.0000 mg | ORAL_TABLET | Freq: Four times a day (QID) | ORAL | Status: DC | PRN

## 2012-08-29 MED ORDER — PANTOPRAZOLE SODIUM 40 MG PO TBEC
40.0000 mg | DELAYED_RELEASE_TABLET | Freq: Every day | ORAL | Status: DC
  Administered 2012-08-30: 40 mg via ORAL
  Filled 2012-08-29: qty 1

## 2012-08-29 MED ORDER — ALUM & MAG HYDROXIDE-SIMETH 200-200-20 MG/5ML PO SUSP: 30.0000 mL | ORAL | Status: DC | PRN

## 2012-08-29 MED ORDER — LACTATED RINGERS IV SOLN
INTRAVENOUS | Status: DC | PRN
  Administered 2012-08-29 (×2): via INTRAVENOUS

## 2012-08-29 MED ORDER — OXYCODONE HCL 5 MG PO TABS: 5.0000 mg | ORAL_TABLET | Freq: Once | ORAL | Status: DC | PRN

## 2012-08-29 MED ORDER — ONDANSETRON HCL 4 MG/2ML IJ SOLN
4.0000 mg | Freq: Four times a day (QID) | INTRAMUSCULAR | Status: DC | PRN
  Administered 2012-08-30: 4 mg via INTRAVENOUS
  Filled 2012-08-29: qty 2

## 2012-08-29 MED ORDER — ONDANSETRON HCL 4 MG PO TABS: 4.0000 mg | ORAL_TABLET | Freq: Four times a day (QID) | ORAL | Status: DC | PRN

## 2012-08-29 MED ORDER — CEFAZOLIN SODIUM 1-5 GM-% IV SOLN
1.0000 g | Freq: Four times a day (QID) | INTRAVENOUS | Status: AC
  Administered 2012-08-29 – 2012-08-30 (×3): 1 g via INTRAVENOUS
  Filled 2012-08-29 (×3): qty 50

## 2012-08-29 MED ORDER — DIPHENHYDRAMINE HCL 12.5 MG/5ML PO ELIX: 12.5000 mg | ORAL_SOLUTION | ORAL | Status: DC | PRN

## 2012-08-29 MED ORDER — CELECOXIB 200 MG PO CAPS
200.0000 mg | ORAL_CAPSULE | Freq: Two times a day (BID) | ORAL | Status: DC
  Administered 2012-08-29 – 2012-08-30 (×3): 200 mg via ORAL
  Filled 2012-08-29 (×4): qty 1

## 2012-08-29 MED ORDER — METHOCARBAMOL 500 MG PO TABS
500.0000 mg | ORAL_TABLET | Freq: Four times a day (QID) | ORAL | Status: DC | PRN
  Administered 2012-08-29 – 2012-08-30 (×2): 500 mg via ORAL
  Filled 2012-08-29 (×2): qty 1

## 2012-08-29 MED ORDER — BISACODYL 5 MG PO TBEC: 5.0000 mg | DELAYED_RELEASE_TABLET | Freq: Every day | ORAL | Status: DC | PRN

## 2012-08-29 MED ORDER — SODIUM CHLORIDE 0.9 % IR SOLN
Status: DC | PRN
  Administered 2012-08-29: 1000 mL

## 2012-08-29 MED ORDER — PHENOL 1.4 % MT LIQD: 1.0000 | OROMUCOSAL | Status: DC | PRN

## 2012-08-29 MED ORDER — HYDROMORPHONE HCL PF 1 MG/ML IJ SOLN: 0.2500 mg | INTRAMUSCULAR | Status: DC | PRN

## 2012-08-29 MED ORDER — LIDOCAINE HCL 4 % MT SOLN
OROMUCOSAL | Status: DC | PRN
  Administered 2012-08-29: 4 mL via TOPICAL

## 2012-08-29 SURGICAL SUPPLY — 61 items
BLADE SAW SGTL 83.5X18.5 (BLADE) ×2 IMPLANT
CEMENT BONE DEPUY (Cement) ×1 IMPLANT
CLOTH BEACON ORANGE TIMEOUT ST (SAFETY) ×2 IMPLANT
CLSR STERI-STRIP ANTIMIC 1/2X4 (GAUZE/BANDAGES/DRESSINGS) ×1 IMPLANT
COVER SURGICAL LIGHT HANDLE (MISCELLANEOUS) ×2 IMPLANT
DRAPE INCISE IOBAN 66X45 STRL (DRAPES) ×2 IMPLANT
DRAPE SURG 17X11 SM STRL (DRAPES) ×2 IMPLANT
DRAPE SURG 17X23 STRL (DRAPES) ×2 IMPLANT
DRAPE U-SHAPE 47X51 STRL (DRAPES) ×2 IMPLANT
DRILL BIT 7/64X5 (BIT) IMPLANT
DRSG AQUACEL AG ADV 3.5X10 (GAUZE/BANDAGES/DRESSINGS) ×2 IMPLANT
DRSG MEPILEX BORDER 4X8 (GAUZE/BANDAGES/DRESSINGS) ×2 IMPLANT
DURAPREP 26ML APPLICATOR (WOUND CARE) ×4 IMPLANT
ELECT BLADE 4.0 EZ CLEAN MEGAD (MISCELLANEOUS) ×2
ELECT CAUTERY BLADE 6.4 (BLADE) ×2 IMPLANT
ELECT REM PT RETURN 9FT ADLT (ELECTROSURGICAL) ×2
ELECTRODE BLDE 4.0 EZ CLN MEGD (MISCELLANEOUS) ×1 IMPLANT
ELECTRODE REM PT RTRN 9FT ADLT (ELECTROSURGICAL) ×1 IMPLANT
FACESHIELD LNG OPTICON STERILE (SAFETY) ×6 IMPLANT
GLENOID ANCHOR PEG CROSSLK 48 (Orthopedic Implant) ×1 IMPLANT
GLOVE BIO SURGEON STRL SZ7.5 (GLOVE) ×2 IMPLANT
GLOVE BIO SURGEON STRL SZ8 (GLOVE) ×2 IMPLANT
GLOVE EUDERMIC 7 POWDERFREE (GLOVE) ×2 IMPLANT
GLOVE SS BIOGEL STRL SZ 7.5 (GLOVE) ×1 IMPLANT
GLOVE SUPERSENSE BIOGEL SZ 7.5 (GLOVE) ×1
GOWN STRL NON-REIN LRG LVL3 (GOWN DISPOSABLE) ×2 IMPLANT
GOWN STRL REIN XL XLG (GOWN DISPOSABLE) ×4 IMPLANT
HUMERAL HEAD 52MMX21MM (Trauma) ×1 IMPLANT
HUMERAL STEM 16MM (Trauma) ×1 IMPLANT
KIT BASIN OR (CUSTOM PROCEDURE TRAY) ×2 IMPLANT
KIT ROOM TURNOVER OR (KITS) ×2 IMPLANT
MANIFOLD NEPTUNE II (INSTRUMENTS) ×2 IMPLANT
NDL HYPO 25GX1X1/2 BEV (NEEDLE) IMPLANT
NDL SUT 6 .5 CRC .975X.05 MAYO (NEEDLE) ×1 IMPLANT
NEEDLE HYPO 25GX1X1/2 BEV (NEEDLE) IMPLANT
NEEDLE MAYO TAPER (NEEDLE) ×2
NS IRRIG 1000ML POUR BTL (IV SOLUTION) ×2 IMPLANT
PACK SHOULDER (CUSTOM PROCEDURE TRAY) ×2 IMPLANT
PAD ARMBOARD 7.5X6 YLW CONV (MISCELLANEOUS) ×4 IMPLANT
PASSER SUT SWANSON 36MM LOOP (INSTRUMENTS) IMPLANT
PIN METAGLENE 2.5 (PIN) ×1 IMPLANT
SLING ARM FOAM STRAP LRG (SOFTGOODS) IMPLANT
SLING ARM FOAM STRAP XLG (SOFTGOODS) ×2 IMPLANT
SMARTMIX MINI TOWER (MISCELLANEOUS) ×2
SPONGE LAP 18X18 X RAY DECT (DISPOSABLE) ×2 IMPLANT
SPONGE LAP 4X18 X RAY DECT (DISPOSABLE) ×2 IMPLANT
STRIP CLOSURE SKIN 1/2X4 (GAUZE/BANDAGES/DRESSINGS) ×2 IMPLANT
SUCTION FRAZIER TIP 10 FR DISP (SUCTIONS) ×2 IMPLANT
SUT BONE WAX W31G (SUTURE) IMPLANT
SUT FIBERWIRE #2 38 T-5 BLUE (SUTURE) ×6
SUT MNCRL AB 3-0 PS2 18 (SUTURE) ×2 IMPLANT
SUT VIC AB 1 CT1 27 (SUTURE) ×6
SUT VIC AB 1 CT1 27XBRD ANBCTR (SUTURE) ×3 IMPLANT
SUT VIC AB 2-0 CT1 27 (SUTURE) ×6
SUT VIC AB 2-0 CT1 TAPERPNT 27 (SUTURE) ×2 IMPLANT
SUTURE FIBERWR #2 38 T-5 BLUE (SUTURE) ×2 IMPLANT
SYR CONTROL 10ML LL (SYRINGE) IMPLANT
TOWEL OR 17X24 6PK STRL BLUE (TOWEL DISPOSABLE) ×2 IMPLANT
TOWEL OR 17X26 10 PK STRL BLUE (TOWEL DISPOSABLE) ×2 IMPLANT
TOWER SMARTMIX MINI (MISCELLANEOUS) ×1 IMPLANT
WATER STERILE IRR 1000ML POUR (IV SOLUTION) ×2 IMPLANT

## 2012-08-29 NOTE — Progress Notes (Signed)
UR COMPLETED  

## 2012-08-29 NOTE — Op Note (Signed)
08/29/2012  9:52 AM  PATIENT:   Russell Sanford  57 y.o. male  PRE-OPERATIVE DIAGNOSIS:  OA LEFT SHOULDER   POST-OPERATIVE DIAGNOSIS:  same  PROCEDURE:  L TSA 16 press fit stem, 52X21 eccentric head, 48 apg  SURGEON:  Laurance Heide, Vania Rea. M.D.  ASSISTANTS: Shuford pac   ANESTHESIA:   GET + ISB  EBL: 300  SPECIMEN:  none  Drains: none   PATIENT DISPOSITION:  PACU - hemodynamically stable.    PLAN OF CARE: Admit to inpatient   Dictation# [id number]

## 2012-08-29 NOTE — Anesthesia Preprocedure Evaluation (Signed)
Anesthesia Evaluation  Patient identified by MRN, date of birth, ID band Patient awake    Reviewed: Allergy & Precautions, H&P , NPO status , Patient's Chart, lab work & pertinent test results, reviewed documented beta blocker date and time   Airway Mallampati: I TM Distance: >3 FB Neck ROM: Full    Dental  (+) Teeth Intact   Pulmonary          Cardiovascular     Neuro/Psych    GI/Hepatic hiatal hernia, GERD-  Medicated and Controlled,  Endo/Other    Renal/GU      Musculoskeletal   Abdominal   Peds  Hematology   Anesthesia Other Findings   Reproductive/Obstetrics                           Anesthesia Physical Anesthesia Plan  ASA: III  Anesthesia Plan: General   Post-op Pain Management:    Induction: Intravenous  Airway Management Planned: Oral ETT  Additional Equipment:   Intra-op Plan:   Post-operative Plan: Extubation in OR  Informed Consent: I have reviewed the patients History and Physical, chart, labs and discussed the procedure including the risks, benefits and alternatives for the proposed anesthesia with the patient or authorized representative who has indicated his/her understanding and acceptance.   Dental advisory given  Plan Discussed with: CRNA, Anesthesiologist and Surgeon  Anesthesia Plan Comments:         Anesthesia Quick Evaluation

## 2012-08-29 NOTE — Anesthesia Postprocedure Evaluation (Signed)
Anesthesia Post Note  Patient: Russell Sanford  Procedure(s) Performed: Procedure(s) (LRB): LEFT SHOULDER TOTAL ARTHROPLASTY (Left)  Anesthesia type: general  Patient location: PACU  Post pain: Pain level controlled  Post assessment: Patient's Cardiovascular Status Stable  Last Vitals:  Filed Vitals:   08/29/12 1045  BP:   Pulse: 61  Temp:   Resp: 11    Post vital signs: Reviewed and stable  Level of consciousness: sedated  Complications: No apparent anesthesia complications

## 2012-08-29 NOTE — Transfer of Care (Signed)
Immediate Anesthesia Transfer of Care Note  Patient: Russell Sanford  Procedure(s) Performed: Procedure(s): LEFT SHOULDER TOTAL ARTHROPLASTY (Left)  Patient Location: PACU  Anesthesia Type:GA combined with regional for post-op pain  Level of Consciousness: awake, alert  and oriented  Airway & Oxygen Therapy: Patient Spontanous Breathing and Patient connected to nasal cannula oxygen  Post-op Assessment: Report given to PACU RN, Post -op Vital signs reviewed and stable and Patient moving all extremities  Post vital signs: Reviewed and stable  Complications: No apparent anesthesia complications

## 2012-08-29 NOTE — Anesthesia Procedure Notes (Addendum)
Anesthesia Regional Block:  Interscalene brachial plexus block  Pre-Anesthetic Checklist: ,, timeout performed, Correct Patient, Correct Site, Correct Laterality, Correct Procedure, Correct Position, site marked, Risks and benefits discussed,  Surgical consent,  Pre-op evaluation,  At surgeon's request and post-op pain management  Laterality: Left  Prep: chloraprep       Needles:  Injection technique: Single-shot  Needle Type: Echogenic Stimulator Needle     Needle Length: 5cm 5 cm     Additional Needles:  Procedures: ultrasound guided (picture in chart) and nerve stimulator Interscalene brachial plexus block  Nerve Stimulator or Paresthesia:  Response: 0.4 mA,   Additional Responses:   Narrative:  Start time: 08/29/2012 7:05 AM End time: 08/29/2012 7:20 AM Injection made incrementally with aspirations every 5 mL.  Performed by: Personally  Anesthesiologist: Arta Bruce MD  Additional Notes: Monitors applied. Patient sedated. Sterile prep and drape,hand hygiene and sterile gloves were used. Relevant anatomy identified.Needle position confirmed.Local anesthetic injected incrementally after negative aspiration. Local anesthetic spread visualized around nerve(s). Vascular puncture avoided. No complications. Image printed for medical record.The patient tolerated the procedure well.       Interscalene brachial plexus block Procedure Name: Intubation Date/Time: 08/29/2012 7:39 AM Performed by: Charm Barges, Brelyn Woehl R Pre-anesthesia Checklist: Patient identified, Emergency Drugs available, Suction available, Patient being monitored and Timeout performed Patient Re-evaluated:Patient Re-evaluated prior to inductionOxygen Delivery Method: Circle system utilized Preoxygenation: Pre-oxygenation with 100% oxygen Intubation Type: IV induction Ventilation: Mask ventilation without difficulty Laryngoscope Size: Mac and 4 Grade View: Grade II Tube type: Oral Tube size: 8.0 mm Number of  attempts: 1 Airway Equipment and Method: Stylet Placement Confirmation: ETT inserted through vocal cords under direct vision,  positive ETCO2 and breath sounds checked- equal and bilateral Secured at: 22 cm Tube secured with: Tape Dental Injury: Teeth and Oropharynx as per pre-operative assessment

## 2012-08-29 NOTE — H&P (Signed)
Russell Sanford    Chief Complaint: OA LEFT SHOULDER  HPI: The patient is a 57 y.o. male with end stage left shoulder OA  Past Medical History  Diagnosis Date  . HH (hiatus hernia)   . Barrett's esophagus   . Arthritis   . GERD (gastroesophageal reflux disease)   . Hypercholesteremia   . DVT (deep venous thrombosis)     a. Posterior tibial vein DVT 05/2012 following L TKA.  . Pulmonary embolism     a. Large burden bilat PE on CTA 05/2012 following L TKA. F/U CT 07/02/12 showed recanalization of the vessels involved with PE  . Dysrhythmia     Past Surgical History  Procedure Laterality Date  . Knee arthroscopy      bilateral  . Shoulder arthroscopy      bilateral  . Toe amputation      left big toe  . Fracture surgery      left lower leg  . Lasik    . Total knee arthroplasty  05/17/2012    Procedure: TOTAL KNEE ARTHROPLASTY;  Surgeon: Eugenia Mcalpine, MD;  Location: [facility];  Service: Orthopedics;  Laterality: Left;    Family History  Problem Relation Age of Onset  . Heart attack Father 3    Father died at age 37 of massive MI  . Arrhythmia Mother     Scarlet fever -> "fibrillation"    Social History:  reports that he has never smoked. He has never used smokeless tobacco. He reports that he does not drink alcohol or use illicit drugs.  Allergies:  Allergies  Allergen Reactions  . Sulfonamide Derivatives Rash    Medications Prior to Admission  Medication Sig Dispense Refill  . celecoxib (CELEBREX) 200 MG capsule Take 1 capsule (200 mg total) by mouth 2 (two) times daily.  12 capsule  0  . nebivolol (BYSTOLIC) 5 MG tablet Take 5 mg by mouth daily with supper.      Marland Kitchen omeprazole (PRILOSEC OTC) 20 MG tablet Take 20 mg by mouth every morning.       . rosuvastatin (CRESTOR) 20 MG tablet Take 20 mg by mouth daily with supper.      Marland Kitchen atorvastatin (LIPITOR) 20 MG tablet Take 1 tablet (20 mg total) by mouth daily.  30 tablet  1  . Rivaroxaban (XARELTO) 15 MG TABS tablet Take  15 mg by mouth daily with supper.         Physical Exam: left shoulder with painful and restricted ROM as noted at recent office visit  Vitals  Temp:  [97.6 F (36.4 C)] 97.6 F (36.4 C) (05/15 0618) Pulse Rate:  [63] 63 (05/15 0618) Resp:  [20] 20 (05/15 0618) BP: (124)/(77) 124/77 mmHg (05/15 0618) SpO2:  [98 %] 98 % (05/15 0618)  Assessment/Plan  Impression: OA LEFT SHOULDER   Plan of Action: Procedure(s): LEFT SHOULDER TOTAL ARTHROPLASTY  Russell Sanford M 08/29/2012, 7:22 AM

## 2012-08-29 NOTE — Preoperative (Signed)
Beta Blockers   Reason not to administer Beta Blockers:Patient took Nebivolol at 1800 hrs on 08/28/12

## 2012-08-30 ENCOUNTER — Encounter (HOSPITAL_COMMUNITY): Payer: Self-pay | Admitting: Orthopedic Surgery

## 2012-08-30 MED ORDER — HYDROMORPHONE HCL PF 1 MG/ML IJ SOLN
1.0000 mg | INTRAMUSCULAR | Status: DC | PRN
  Administered 2012-08-30: 1 mg via INTRAVENOUS
  Filled 2012-08-30: qty 1

## 2012-08-30 MED ORDER — DIAZEPAM 5 MG/ML IJ SOLN
5.0000 mg | INTRAMUSCULAR | Status: DC | PRN
  Administered 2012-08-30: 5 mg via INTRAVENOUS
  Filled 2012-08-30: qty 2

## 2012-08-30 MED ORDER — HYDROMORPHONE HCL 2 MG PO TABS
4.0000 mg | ORAL_TABLET | ORAL | Status: DC | PRN
  Filled 2012-08-30: qty 3

## 2012-08-30 MED ORDER — HYDROMORPHONE HCL 4 MG PO TABS: 4.0000 mg | ORAL_TABLET | ORAL | Status: DC | PRN

## 2012-08-30 MED ORDER — HYDROMORPHONE HCL 2 MG PO TABS: 4.0000 mg | ORAL_TABLET | ORAL | Status: DC | PRN

## 2012-08-30 MED ORDER — HYDROMORPHONE HCL 2 MG PO TABS
2.0000 mg | ORAL_TABLET | ORAL | Status: DC | PRN
  Administered 2012-08-30: 4 mg via ORAL
  Filled 2012-08-30: qty 2

## 2012-08-30 MED ORDER — DIAZEPAM 5 MG PO TABS: 5.0000 mg | ORAL_TABLET | Freq: Four times a day (QID) | ORAL | Status: DC | PRN

## 2012-08-30 MED ORDER — RIVAROXABAN 20 MG PO TABS
20.0000 mg | ORAL_TABLET | Freq: Every day | ORAL | Status: DC
  Filled 2012-08-30: qty 1

## 2012-08-30 MED ORDER — RIVAROXABAN 20 MG PO TABS: 20.0000 mg | ORAL_TABLET | Freq: Every day | ORAL | Status: DC

## 2012-08-30 NOTE — Discharge Summary (Signed)
PATIENT ID:      Russell Sanford  MRN:     [id number] DOB/AGE:    1955/12/01 / 57 y.o.     DISCHARGE SUMMARY  ADMISSION DATE:    08/29/2012 DISCHARGE DATE:    ADMISSION DIAGNOSIS: OA LEFT SHOULDER  Past Medical History  Diagnosis Date  . HH (hiatus hernia)   . Barrett's esophagus   . Arthritis   . GERD (gastroesophageal reflux disease)   . Hypercholesteremia   . DVT (deep venous thrombosis)     a. Posterior tibial vein DVT 05/2012 following L TKA.  . Pulmonary embolism     a. Large burden bilat PE on CTA 05/2012 following L TKA. F/U CT 07/02/12 showed recanalization of the vessels involved with PE  . Dysrhythmia     DISCHARGE DIAGNOSIS:   Active Problems:   Shoulder arthritis   PROCEDURE: Procedure(s): LEFT SHOULDER TOTAL ARTHROPLASTY on 08/29/2012  CONSULTS:     HISTORY:  See H&P in chart.  HOSPITAL COURSE:  Russell Sanford is a 57 y.o. admitted on 08/29/2012 with a chief complaint of No chief complaint on file. , and found to have a diagnosis of OA LEFT SHOULDER .  They were brought to the operating room on 08/29/2012 and underwent Procedure(s): LEFT SHOULDER TOTAL ARTHROPLASTY.    They were given perioperative antibiotics: Anti-infectives   Start     Dose/Rate Route Frequency Ordered Stop   08/29/12 1400  ceFAZolin (ANCEF) IVPB 1 g/50 mL premix     1 g 100 mL/hr over 30 Minutes Intravenous Every 6 hours 08/29/12 1246 08/30/12 0254   08/29/12 0600  ceFAZolin (ANCEF) 3 g in dextrose 5 % 50 mL IVPB     3 g 160 mL/hr over 30 Minutes Intravenous On call to O.R. 08/28/12 1401 08/29/12 0730    .  Patient underwent the above named procedure and tolerated it well. He was in a fair amount of pain the am following surgery and medications were adjusted. The following day they were hemodynamically stable and pain was controlled on oral analgesics. They were neurovascularly intact to the operative extremity. OT was ordered and worked with patient per protocol. They were medically and  orthopaedically stable for discharge on Day 1 .    DIAGNOSTIC STUDIES:  RECENT RADIOGRAPHIC STUDIES :  Dg Chest 2 View  08/23/2012   *RADIOLOGY REPORT*  Clinical Data: Hernia, reflux, preoperative evaluation  CHEST - 2 VIEW  Comparison:  None.  Findings:  The heart size and mediastinal contours are within normal limits.  Both lungs are clear.  The visualized skeletal structures are unremarkable. Chronic degenerative changes of the right distal clavicle.  Mild thoracic spondylosis.  IMPRESSION: No active cardiopulmonary disease.   Original Report Authenticated By: Russell Sanford. Shick, M.D.    RECENT VITAL SIGNS:  Patient Vitals for the past 24 hrs:  BP Temp Temp src Pulse Resp SpO2 Height Weight  08/30/12 0538 151/62 mmHg 98.8 F (37.1 C) - 72 18 99 % - -  08/30/12 0211 115/67 mmHg 99.3 F (37.4 C) - 67 18 99 % - -  08/29/12 2200 112/52 mmHg 98.8 F (37.1 C) - 69 18 98 % - -  08/29/12 1450 120/79 mmHg 97.8 F (36.6 C) Oral 73 18 98 % - -  08/29/12 1316 - - - - - - 6' 0.83" (1.85 m) 117 kg (257 lb 15 oz)  08/29/12 1202 - 97.7 F (36.5 C) - 73 17 98 % - -  08/29/12 1145 - - -  67 16 97 % - -  08/29/12 1139 116/73 mmHg - - - - - - -  08/29/12 1115 - - - 63 17 97 % - -  08/29/12 1109 - - - 83 20 98 % - -  08/29/12 1100 - - - 76 17 97 % - -  08/29/12 1054 130/84 mmHg - - - - - - -  08/29/12 1045 - - - 61 11 96 % - -  08/29/12 1039 130/81 mmHg - - - - - - -  08/29/12 1030 - - - 68 13 99 % - -  08/29/12 1024 125/79 mmHg - - - - - - -  08/29/12 1015 - - - 85 17 98 % - -  08/29/12 1014 - 98.2 F (36.8 C) - - - - - -  08/29/12 1009 137/81 mmHg - - - - - - -  .  RECENT EKG RESULTS:    Orders placed during the hospital encounter of 07/01/12  . EKG 12-LEAD  . EKG 12-LEAD  . EKG  . EKG 12-LEAD  . EKG 12-LEAD  . EKG 12-LEAD  . EKG 12-LEAD  . EKG 12-LEAD  . EKG 12-LEAD  . EXERCISE TOLERANCE TEST  . EXERCISE TOLERANCE TEST  . EKG  . EKG    DISCHARGE INSTRUCTIONS:    DISCHARGE  MEDICATIONS:     Medication List    TAKE these medications       atorvastatin 20 MG tablet  Commonly known as:  LIPITOR  Take 1 tablet (20 mg total) by mouth daily.     celecoxib 200 MG capsule  Commonly known as:  CELEBREX  Take 1 capsule (200 mg total) by mouth 2 (two) times daily.     diazepam 5 MG tablet  Commonly known as:  VALIUM  Take 1 tablet (5 mg total) by mouth every 6 (six) hours as needed (spasm).     HYDROmorphone 2 MG tablet  Commonly known as:  DILAUDID  Take 2-4 tablets (4-8 mg total) by mouth every 4 (four) hours as needed for pain.     nebivolol 5 MG tablet  Commonly known as:  BYSTOLIC  Take 5 mg by mouth daily with supper.     omeprazole 20 MG tablet  Commonly known as:  PRILOSEC OTC  Take 20 mg by mouth every morning.     Rivaroxaban 15 MG Tabs tablet  Commonly known as:  XARELTO  Take 15 mg by mouth daily with supper.     rosuvastatin 20 MG tablet  Commonly known as:  CRESTOR  Take 20 mg by mouth daily with supper.        FOLLOW UP VISIT:       Follow-up Information   Follow up with SUPPLE,KEVIN M, MD. (call to be seen in 10-14 days)    Contact information:   [address], [address] [place] [address] [phone]       DISCHARGE TO: Home   DISPOSITION: Good  DISCHARGE CONDITION:  Russell Sanford for Dr. Francena Hanly 08/30/2012, 7:37 AM

## 2012-08-30 NOTE — Evaluation (Addendum)
Occupational Therapy Evaluation Patient Details Name: Russell Sanford MRN: [id number] DOB: [birth year] Today's Date: 08/30/2012 Time: 8657-8469 OT Time Calculation (min): 66 min  OT Assessment / Plan / Recommendation Clinical Impression   57 y.o. S/p Left TSA. Reviewed all shoulder information with pt and wife. Practiced all exercises. Wife will be at home 24/7 to assist as needed. Pt's wife wanted HHOT upon d/c.     OT Assessment  All further OT needs can be met in the next venue of care    Follow Up Recommendations  Home health OT    Barriers to Discharge      Equipment Recommendations  None recommended by OT    Recommendations for Other Services    Frequency       Precautions / Restrictions Precautions Precautions: Shoulder Type of Shoulder Precautions: Supple TSA protocol Precaution Booklet Issued: Yes (comment) Required Braces or Orthoses: Other Brace/Splint (shoulder sling) Restrictions Weight Bearing Restrictions: Yes LUE Weight Bearing: Non weight bearing   Pertinent Vitals/Pain Did not rate pain, but pain evident with mobilization. Repositioned. BP 115/70 after performing elbow, hand, wrist exercises while standing and BP taken after sitting back down (pt was sweating)    ADL  Toilet Transfer: Other (comment);Simulated;Supervision/safety (from bed) Toilet Transfer Method: Sit to stand Toilet Transfer Equipment: Other (comment) (from bed) Equipment Used: Other (comment) (shoulder sling) ADL Comments: Reviewed all shoulder information with pt and wife. Went over all exercises.  Minimal cues for pt to maintain precautions.    OT Diagnosis: Acute pain  OT Problem List: Decreased range of motion;Decreased knowledge of precautions;Pain OT Treatment Interventions:     OT Goals    Visit Information  Last OT Received On: 08/30/12 Assistance Needed: +1    Subjective Data      Prior Functioning     Home Living Lives With: Spouse Available Help at Discharge:  Available 24 hours/day Bathroom Shower/Tub: Health visitor: Handicapped height Prior Function Level of Independence: Needs assistance Needs Assistance: Dressing Dressing: Minimal (tucking in shirt) Communication Communication: No difficulties Dominant Hand: Right         Vision/Perception     Cognition  Cognition Arousal/Alertness: Awake/alert Behavior During Therapy: WFL for tasks assessed/performed Overall Cognitive Status: Within Functional Limits for tasks assessed    Extremity/Trunk Assessment Right Upper Extremity Assessment RUE ROM/Strength/Tone: [facility] for tasks assessed Left Upper Extremity Assessment LUE ROM/Strength/Tone: Deficits;Due to precautions;Due to pain LUE ROM/Strength/Tone Deficits: TSA     Mobility Bed Mobility Bed Mobility: Supine to Sit;Sitting - Scoot to Edge of Bed Supine to Sit: 5: Supervision Sitting - Scoot to Edge of Bed: 5: Supervision Details for Bed Mobility Assistance: Increased time.supervision for safety Transfers Transfers: Sit to Stand;Stand to Sit Sit to Stand: 5: Supervision;From bed Stand to Sit: 5: Supervision;To bed Details for Transfer Assistance: supervision for safety     Exercise Shoulder Exercises Pendulum Exercise: PROM;Left;10 reps;Standing Shoulder Flexion: PROM;Left;10 reps;Seated Shoulder ABduction: PROM;Left;10 reps;Seated Shoulder External Rotation: PROM;Left;10 reps;Seated Elbow Flexion: AROM;Left;10 reps;Standing Elbow Extension: AROM;Left;10 reps;Standing Wrist Flexion: AROM;Left;10 reps;Standing Wrist Extension: AROM;Left;10 reps;Standing Digit Composite Flexion: AROM;Left;10 reps;Standing Composite Extension: AROM;Left;10 reps;Standing Neck Flexion: AROM;10 reps;Standing Neck Extension: AROM;10 reps;Standing Neck Lateral Flexion - Right: AROM;10 reps;Standing Neck Lateral Flexion - Left: AROM;10 reps;Standing Donning/doffing shirt without moving shoulder: Patient able to independently  direct caregiver Method for sponge bathing under operated UE: Patient able to independently direct caregiver Donning/doffing sling/immobilizer: Patient able to independently direct caregiver Correct positioning of sling/immobilizer: Patient able to independently direct caregiver  Pendulum exercises (written home exercise program): Supervision/safety ROM for elbow, wrist and digits of operated UE: Supervision/safety Sling wearing schedule (on at all times/off for ADL's): Patient able to independently direct caregiver Proper positioning of operated UE when showering: Patient able to independently direct caregiver Positioning of UE while sleeping: Patient able to independently direct caregiver   Balance     End of Session OT - End of Session Equipment Utilized During Treatment: Other (comment) (shoulder sling) Activity Tolerance: Patient tolerated treatment well (became nauseaus and vomited ) Patient left: in bed;with family/visitor present Nurse Communication: Mobility status  GO     Earlie Raveling OTR/L [phone] 08/30/2012, 12:21 PM

## 2012-08-30 NOTE — Op Note (Signed)
NAMEYUMA, PACELLA NO.:  [id number]  MEDICAL RECORD NO.:  [id number]  LOCATION:  5N04C                        FACILITY:  [facility]  PHYSICIAN:  Vania Rea. Kerina Simoneau, M.D.  DATE OF BIRTH:  [birth year]  DATE OF PROCEDURE:  08/29/2012 DATE OF DISCHARGE:                              OPERATIVE REPORT   POSTOPERATIVE DIAGNOSIS:  End-stage left shoulder osteoarthrosis.  POSTOPERATIVE DIAGNOSIS:  End-stage left shoulder osteoarthrosis.  PROCEDURE:  A left total shoulder arthroplasty utilizing a press-fit size 16 DePuy global stem, a 52 x 21 eccentric humeral head, and a 48 pegged cemented glenoid.  SURGEON:  Vania Rea. Deandria Klute, MD  ASSISTANT:  Lucita Lora. Shuford, PA-C  ANESTHESIA:  General endotracheal as well as an interscalene block.  ESTIMATED BLOOD LOSS:  300 mL.  DRAINS:  None.  HISTORY:  Mr. Butch is a 57 year old gentleman who has had chronic and progressive increasing left shoulder pain with marked restrictions in mobility and increasing functional limitations related to end-stage glenohumeral arthrosis.  He was brought to the operating room at this time for planned left total shoulder arthroplasty.  Preoperatively, I counseled Mr. Eckstein on treatment options as well as risks versus benefits thereof.  Possible surgical complications were reviewed including potential for bleeding, infection, neurovascular injury, persistent pain, loss of motion, anesthetic complication, failure of the implant, possible need for additional surgery.  He understands and accepts and agrees with our planned procedure.  PROCEDURE IN DETAIL:  After undergoing routine preop evaluation, the patient received prophylactic antibiotics and an interscalene block was established in the holding area by the Anesthesia Department.  Placed supine on the operative table and underwent smooth induction of a general endotracheal anesthesia.  Placed in beach-chair position and appropriate padded and  protected.  Left shoulder girdle region was then sterilely prepped and draped in standard fashion.  Time-out was called. An anterior deltopectoral approach to the left shoulder was made through a 15 cm anterior incision.  Skin flaps were elevated.  Electrocautery was used for hemostasis.  The dissection carried deeply.  The cephalic vein was identified and retracted laterally with the deltoid and pectoralis retracted medially.  Interval was developed from proximal to distal in the upper cm of the thick major was tenotomized for enhanced visualization.  Divided adhesions beneath the deltoid, placed self- retaining retractors.  The conjoined tendon was then identified and mobilized retracted medially.  At this point, the biceps tendon was identified.  The bicipital groove was unroofed, biceps was tenotomized for later tenodesis.  We then split the rotator cuff along the rotator interval from the bicipital groove to the base of the coracoid.  The subscapularis was then divided from its lesser tuberosity insertion leaving 1 cm cuff of tissue for later repair and the free margin was then tagged with a series of #2 grasping FiberWire sutures.  Then mobilized subscapularis and then divided the subscap and the tissues from the anteroinferior and inferior aspect of the humeral neck allowing delivery of the humeral head through the wound.  Released the capsule back past the 6 o'clock to the 7 and 8 o'clock position.  Large peripheral osteophytes were noted anteriorly and inferiorly.  Using the extramedullary  guide, we then outlined our proposed humeral head resection, performed this with an oscillating saw, taking care to protect the rotator cuff attachment.  We then used a rongeur to remove the peripheral osteophytes from the anterior and inferior margins of the humerus.  The humeral canal was then prepared with hand reaming up to size 16.  The 16 broach was then used to establish the proper  version at approximately 30 degrees of retroversion.  We then broached up to size 16 with excellent fit and fixation.  At this point, we placed a metal cap over the cut surface of the proximal humerus and then exposed the glenoid.  We used the [employer] and snake tongue retractor to gain circumferential exposure of the glenoid.  I divided the capsule anteriorly and the subscapularis was appropriately mobilized and then performed a circumferential resection of the labrum including residual proximal stump of the biceps allowing complete visualization of the margin of the glenoid.  At this point, the glenoid was measured with 48 tap to the appropriate size coverage.  A guide pin was placed into the center of the glenoid and then we reamed with the 48 reamer correcting some moderate glenoid retroversion.  Once the subchondral bed had been achieved, we placed a central drill hole, then the peripheral peg holes using the appropriate guide, then used a rongeur to remove residual osteophytes at the margin of the glenoid.  The trial glenoid showed excellent fit and fixation.  At this point, the glenoid was irrigated. Cement was mixed at the appropriate consistency and introduced cement into the peripheral peg holes.  The final 48 glenoid was impacted into position with excellent fit and fixation.  The cement was allowed to harden.  We then returned our attention to the proximal humerus, which was irrigated.  We impacted the size 16 stem using bone graft harvested from the resected humeral head to bone graft and capsule portion with excellent fit and fixation.  I then performed a series of trial reductions and ultimately the 52 x 21 eccentric head showed the best coverage and soft tissue balance.  The [facility] taper was meticulously cleaned and dried.  The 52 x 21 eccentric head was impacted into position.  Final reduction was completed.  There was approximately 50% posterior translation in the  humeral head and  glenoid.  We then repaired the subscapularis back to the lesser tuberosity through the cuff of tissue using the #2 FiberWires.  Excellent reapproximation of rotator interval was also closed with a pair of figure-of-eight #2 FiberWire sutures.  We then performed a biceps tendon tenodesis at the level of the superior pegged major tendon with a #2 FiberWire.  The wound was again copiously irrigated.  Hemostasis was obtained. Deltopectoral interval was then reapproximated with 2-0 Vicryl.  I should mention that the cephalic vein was traumatized and that was electrocauterized at this mid incision point.  The deltopectoral interval was then re-closed with 2-0 Vicryl and 3-0 Monocryl used for subcu layer, followed by Steri-Strips.  Tracy A. Shuford, PA-C was used as an Geophysicist/field seismologist throughout this case essential for help with positioning extremity, management of the retractors, manipulation of the extremity, tissue manipulation, wound closure and intraoperative decision making.  Steri-Strips and dry dressing placed over the left shoulder, left arm placed in a sling.  The patient was awakened, extubated, and taken to recovery room in stable condition.     Vania Rea. Dakisha Schoof, M.D.     KMS/MEDQ  D:  08/29/2012  T:  08/30/2012  Job:  [id number]

## 2012-09-02 NOTE — Progress Notes (Signed)
   CARE MANAGEMENT NOTE 09/02/2012  Patient:  Russell Sanford, Russell Sanford   Account Number:  [id number]  Date Initiated:  09/02/2012  Documentation initiated by:  [facility]  Subjective/Objective Assessment:   OA LEFT SHOULDER     Action/Plan:   Anticipated DC Date:  08/30/2012   Anticipated DC Plan:  HOME W HOME HEALTH SERVICES      DC Planning Services  CM consult      [facility] Choice  HOME HEALTH   Choice offered to / List presented to:  NA        HH arranged  HH-2 PT  HH-3 OT      [facility] agency  [facility]   Status of service:  Completed, signed off Medicare Important Message given?   (If response is "NO", the following Medicare IM given date fields will be blank) Date Medicare IM given:   Date Additional Medicare IM given:    Discharge Disposition:  HOME W HOME HEALTH SERVICES  Per UR Regulation:    If discussed at Long Length of Stay Meetings, dates discussed:    Comments:  09/02/2012 1145 Pt was preoperatively set up with Genevieve Norlander. Isidoro Donning RN CCM Case Mgmt phone [phone]

## 2012-10-01 ENCOUNTER — Telehealth: Payer: Self-pay | Admitting: Family Medicine

## 2012-10-01 ENCOUNTER — Encounter: Payer: Self-pay | Admitting: Family Medicine

## 2012-10-01 ENCOUNTER — Ambulatory Visit
Admission: RE | Admit: 2012-10-01 | Discharge: 2012-10-01 | Disposition: A | Discharge status: Home / Self Care | Payer: BC Managed Care – PPO | Source: Ambulatory Visit | Attending: Family Medicine | Admitting: Family Medicine

## 2012-10-01 ENCOUNTER — Ambulatory Visit (INDEPENDENT_AMBULATORY_CARE_PROVIDER_SITE_OTHER): Payer: BC Managed Care – PPO | Admitting: Family Medicine

## 2012-10-01 VITALS — BP 134/80 | HR 82 | Wt 250.0 lb

## 2012-10-01 DIAGNOSIS — R51 Headache: Secondary | ICD-10-CM

## 2012-10-01 DIAGNOSIS — R5381 Other malaise: Secondary | ICD-10-CM

## 2012-10-01 DIAGNOSIS — H538 Other visual disturbances: Secondary | ICD-10-CM

## 2012-10-01 DIAGNOSIS — R5383 Other fatigue: Secondary | ICD-10-CM

## 2012-10-01 LAB — CBC WITH DIFFERENTIAL/PLATELET
Eosinophils Absolute: 0.1 10*3/uL (ref 0.0–0.7)
Eosinophils Relative: 1 % (ref 0–5)
HCT: 44.8 % (ref 39.0–52.0)
Hemoglobin: 15.6 g/dL (ref 13.0–17.0)
Lymphs Abs: 1.6 10*3/uL (ref 0.7–4.0)
MCH: 31 pg (ref 26.0–34.0)
MCV: 89.1 fL (ref 78.0–100.0)
Monocytes Absolute: 0.5 10*3/uL (ref 0.1–1.0)
Monocytes Relative: 7 % (ref 3–12)
RBC: 5.03 MIL/uL (ref 4.22–5.81)

## 2012-10-01 LAB — LIPID PANEL
HDL: 47 mg/dL (ref >39)
Total CHOL/HDL Ratio: 3.9 Ratio
VLDL: 45 mg/dL — ABNORMAL HIGH (ref 0–40)

## 2012-10-01 LAB — COMPREHENSIVE METABOLIC PANEL
AST: 20 U/L (ref 0–37)
Albumin: 4.8 g/dL (ref 3.5–5.2)
Alkaline Phosphatase: 74 U/L (ref 39–117)
Potassium: 4.5 mEq/L (ref 3.5–5.3)
Sodium: 139 mEq/L (ref 135–145)
Total Bilirubin: 0.9 mg/dL (ref 0.3–1.2)
Total Protein: 7.4 g/dL (ref 6.0–8.3)

## 2012-10-01 MED ORDER — IOHEXOL 300 MG/ML  SOLN
75.0000 mL | Freq: Once | INTRAMUSCULAR | Status: AC | PRN
  Administered 2012-10-01: 75 mL via INTRAVENOUS

## 2012-10-01 NOTE — Progress Notes (Signed)
  Subjective:    Patient ID: Russell Sanford, male    DOB: 1955/11/21, 57 y.o.   MRN: [id number]  HPI He had shoulder replacement surgery about a month ago. Since then he has had intermittent difficulty with alternating diarrhea and normal stools, fatigue. Approximately one week ago he stopped all medications except Celebrex and Prilosec thinking this might be what causing his symptoms.4 days ago he woke up with an occipital headache. He was sleeping in a different bed at the time. The headache went away on its own later in the day. He now notes a headache more in the parietal area and behind his eyes. When he has a headache he also has blurred vision and dizziness. Started back on XARELTO last night.he has had no chest pain, shortness of breath, leg pain or swelling   Review of Systems     Objective:   Physical Exam alert and in no distress. EOMI. Other cranial nerves grossly intact. Cerebellar testing showed slight instability but no lateralizing movementTympanic membranes and canals are normal. Throat is clear. Tonsils are normal. Neck is supple without adenopathy or thyromegaly. Cardiac exam shows a regular sinus rhythm without murmurs or gallops. Lungs are clear to auscultation.abdominal exam shows no masses or tenderness. Lower extremity shows no swelling or edema.   Homan's sign negative     Assessment & Plan:  Headache(784.0) - Plan: CT Head W Wo Contrast  Blurred vision, bilateral - Plan: CT Head W Wo Contrast  Fatigue - Plan: CBC with Differential, Lipid panel, Comprehensive metabolic panel etiology of her symptoms is really unclear.

## 2012-10-01 NOTE — Telephone Encounter (Signed)
PT HAS APPOINTMENT TODAY

## 2012-10-01 NOTE — Telephone Encounter (Signed)
It is unlikely that the medication is causing this. If she keeps up than he needs an appointment

## 2012-10-01 NOTE — Telephone Encounter (Signed)
Pt called he states he was switched from Crestor to Lipitor due to insurance coverage.  He states he is having blurred vision, headaches, lathargic.  Is this a side effect of the new medicine?  [phone]

## 2012-10-02 NOTE — Progress Notes (Signed)
Quick Note:  I have informed him of the results of the blood work and CT scan. Encouraged him to take 2 Aleve twice per day and see what this does for his headache and blurred vision. ______

## 2013-03-27 ENCOUNTER — Telehealth: Payer: Self-pay | Admitting: Internal Medicine

## 2013-03-27 NOTE — Telephone Encounter (Signed)
Faxed over medical records to [facility] @ [phone] 

## 2013-07-31 ENCOUNTER — Encounter: Payer: Self-pay | Admitting: Family Medicine

## 2013-07-31 ENCOUNTER — Ambulatory Visit (INDEPENDENT_AMBULATORY_CARE_PROVIDER_SITE_OTHER): Payer: BC Managed Care – PPO | Admitting: Family Medicine

## 2013-07-31 ENCOUNTER — Ambulatory Visit
Admission: RE | Admit: 2013-07-31 | Discharge: 2013-07-31 | Disposition: A | Discharge status: Home / Self Care | Payer: BC Managed Care – PPO | Source: Ambulatory Visit | Attending: Family Medicine | Admitting: Family Medicine

## 2013-07-31 VITALS — BP 120/88 | HR 68 | Temp 97.8°F | Wt 275.0 lb

## 2013-07-31 DIAGNOSIS — R1013 Epigastric pain: Secondary | ICD-10-CM

## 2013-07-31 LAB — HEMOCCULT GUIAC POC 1CARD (OFFICE)

## 2013-07-31 NOTE — Progress Notes (Deleted)
   Subjective:    Patient ID: Russell Sanford, male    DOB: Aug 22, 1955, 58 y.o.   MRN: [id number]  HPI  The patient's stomach pain started on Sunday afternoon and has waxed and waned since that time. The patient states that the pain is like a severe cramp and not sharp in nature. The pain is mostly mid-epigastric, but can occasionally switch sides and is sometimes across the whole abdomen. The patient is also experiencing symptoms of nausea w/o vomiting, cold sweats and constipation. The patient had a fever on Monday which has since resolved. The symptoms all tend to wax and wane, coming in waves that last around 30 mins. His last bowel movement was two days ago, which is unusual for him. The patient has experienced this constellation of symptoms two or three times in the past which were all similar in nature and self-resolved after a few days. The patient has not noted anything that improves or worsens his symptoms. His pain is not associated with eating.   The patient denies any changes in urinary frequency or color or stool caliber or color. The patient admits sensation of increased gas/belching but denies symptoms of acid reflux.   The patient was on celebrex 20 years, however now takes aleve prn. On average he takes two regular aleve three times a week and takes two aleve pm at night three times a week.   Review of Systems is negative except per HPI.     Objective:   Physical Exam  Abdominal: Normoactive bowel sounds.Tender without rebound or gaurding. Diffusely tender, most pronounced in the lower left quadrant.   Stool: guiac positive    Assessment & Plan:  Abdominal pain, epigastric - Plan: DG Abd 1 View

## 2013-07-31 NOTE — Progress Notes (Signed)
   Subjective:    Patient ID: Curley Spiceobert L Kell, male    DOB: 14-Mar-1956, 58 y.o.   MRN: [id number]  HPI  The patient's stomach pain started on Sunday afternoon and has waxed and waned since that time. The patient states that the pain is like a severe cramp and not sharp in nature. The pain is mostly mid-epigastric, but can occasionally switch sides and is sometimes across the whole abdomen. The patient is also experiencing symptoms of nausea w/o vomiting, cold sweats and constipation. The patient had a fever on Monday which has since resolved. The symptoms all tend to wax and wane, coming in waves that last around 30 mins. His last bowel movement was two days ago, which is unusual for him. He usually has 2 bowel movements per day. The patient has experienced this constellation of symptoms two or three times in the past which were all similar in nature and self-resolved after a few days. The patient has not noted anything that improves or worsens his symptoms. His pain is not associated with eating.   The patient denies any changes in urinary frequency or color or stool caliber or color. The patient admits sensation of increased gas/belching but denies symptoms of acid reflux.   The patient was on celebrex 20 years, however now takes aleve prn. On average he takes two regular aleve three times a week and takes two aleve pm at night three times a week.   Review of Systems is negative except per HPI.     Objective:   Physical Exam  Abdominal: Normoactive bowel sounds.Tender without rebound or gaurding. Diffusely tender, most pronounced in the lower left quadrant.   Stool: guiac negative    Assessment & Plan:  Abdominal pain, epigastric - Plan: DG Abd 1 View  the x-ray was nonspecific. I will have him try milk of magnesia to help with his symptoms. If he continues to have difficulty, I will have him return here for followup evaluation.

## 2013-07-31 NOTE — Progress Notes (Deleted)
   Subjective:    Patient ID: Russell Sanford, male    DOB: 28-Jun-1955, 58 y.o.   MRN: [id number]  HPI    Review of Systems     Objective:   Physical Exam        Assessment & Plan:

## 2013-09-13 IMAGING — CR DG CHEST 2V
3 series · 3 of 3 positions shown · non-contrast
Comparison: 05/13/2009

CLINICAL DATA: Chest pain, shortness of breath, recent knee surgery

CHEST - 2 VIEW

[x chest ap]
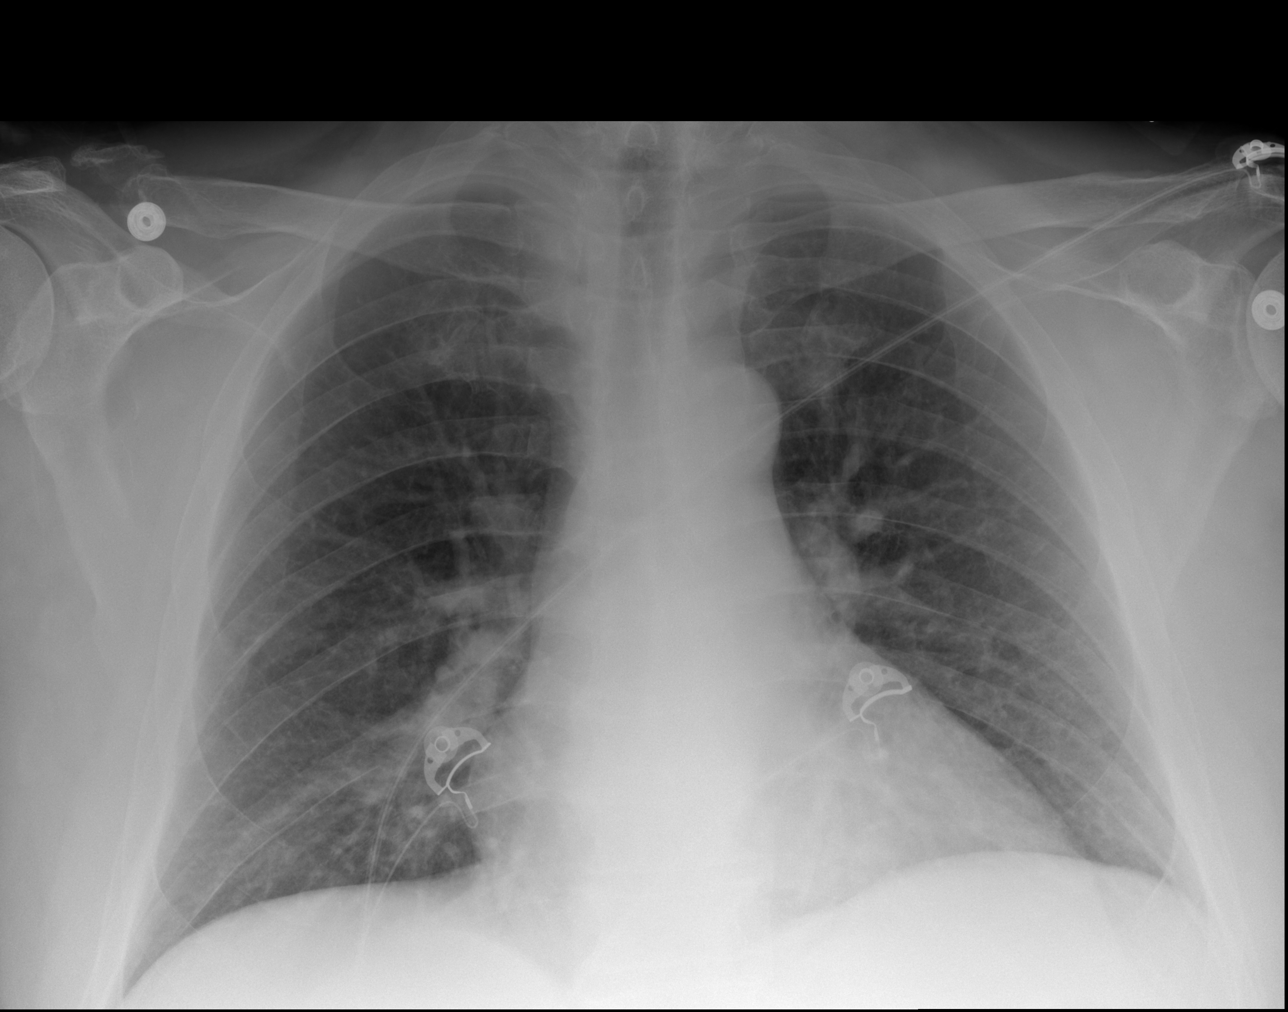

[w chest lat (1 of 2)]
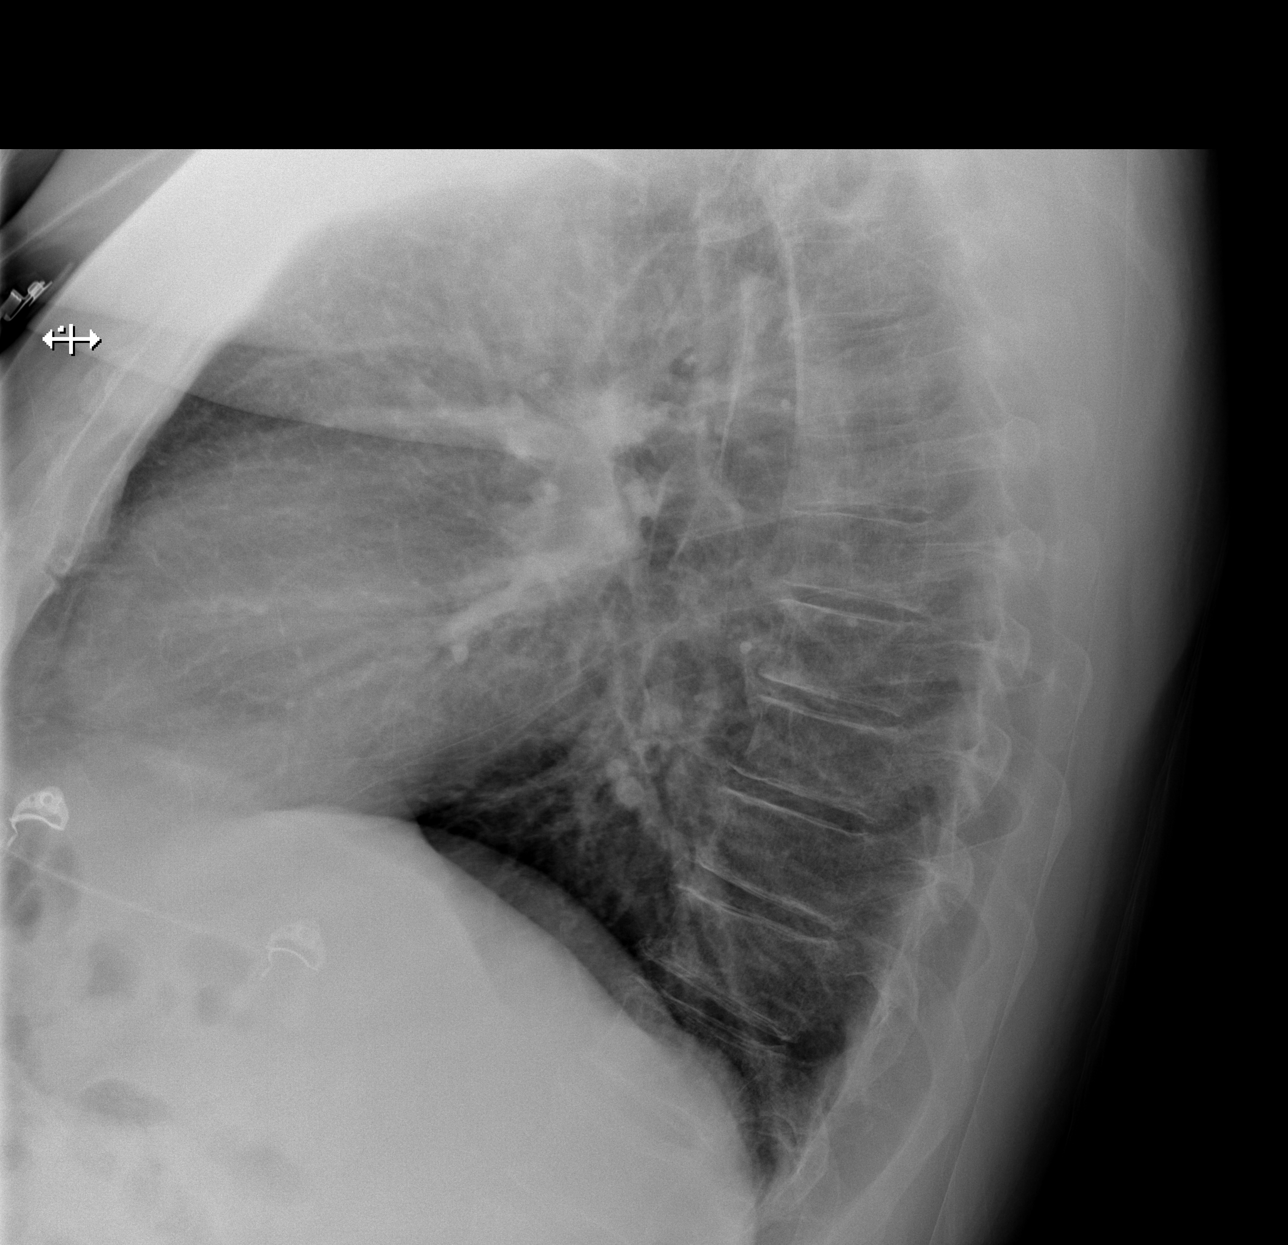

[w chest lat (2 of 2)]
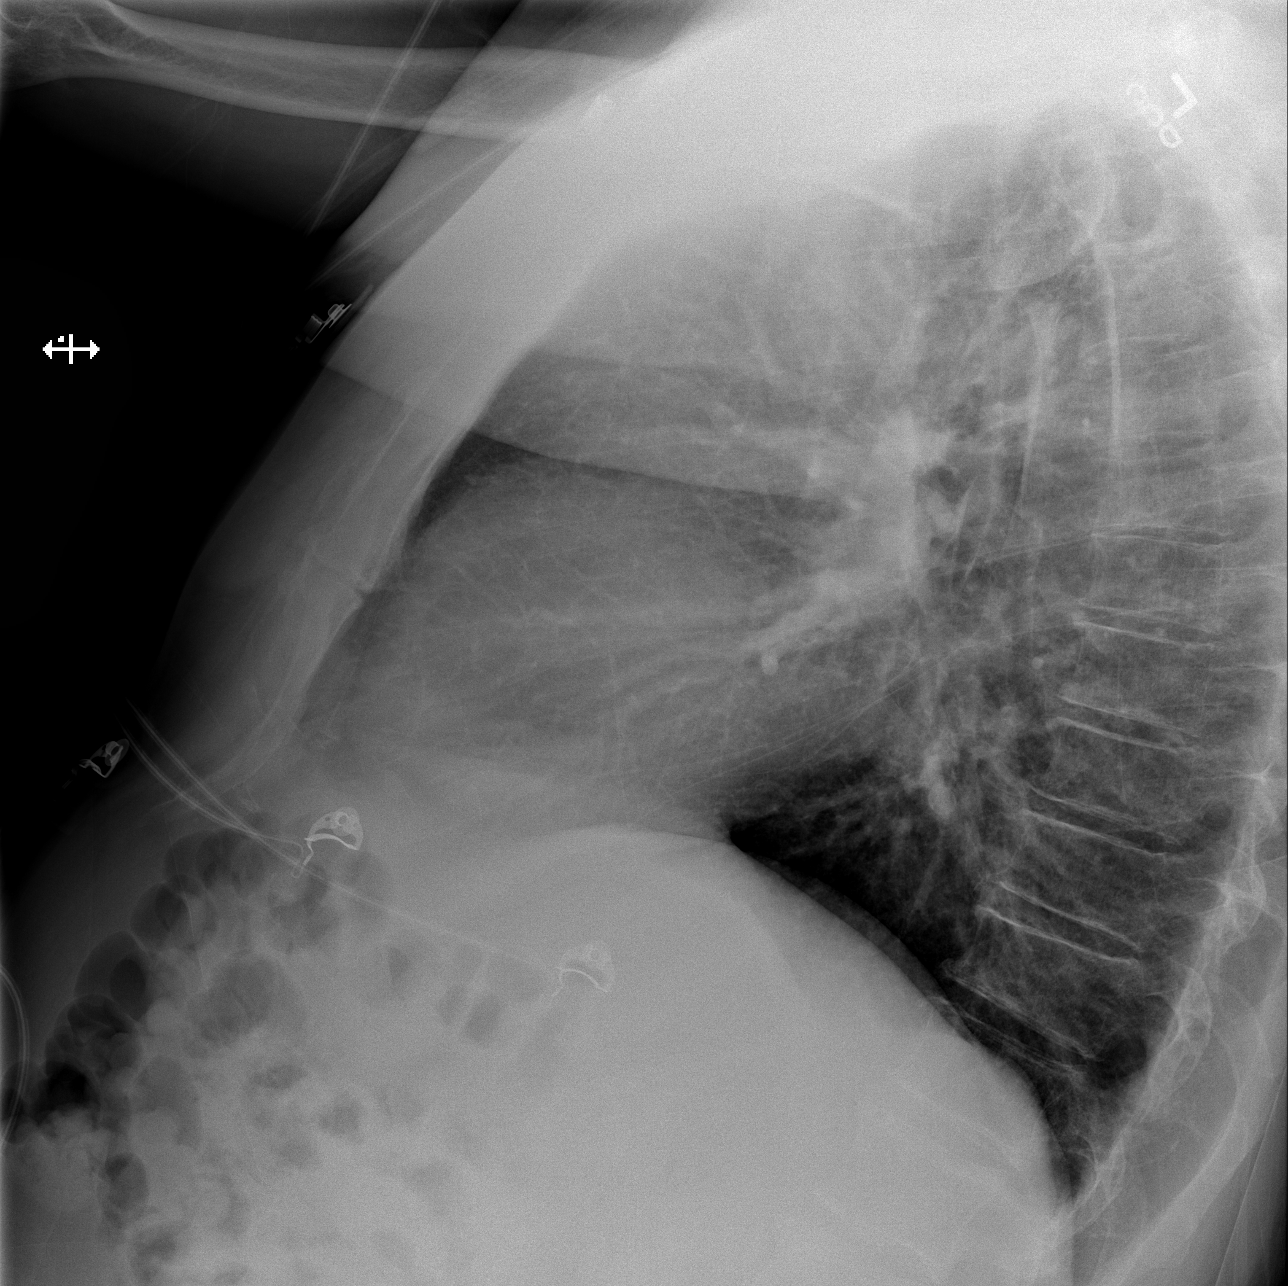

[3 of 3 positions shown; findings below may reference images not displayed]

FINDINGS: Mild enlargement of cardiac silhouette.
Tortuous aorta.
Pulmonary vascularity questionably slightly congested.
No acute infiltrate, pleural effusion or pneumothorax.
Bones unremarkable.
IMPRESSION: Mild enlargement of cardiac silhouette with question slight
pulmonary vascular congestion.
No acute infiltrate.

## 2014-01-14 ENCOUNTER — Ambulatory Visit (INDEPENDENT_AMBULATORY_CARE_PROVIDER_SITE_OTHER): Payer: BC Managed Care – PPO | Admitting: Family Medicine

## 2014-01-14 ENCOUNTER — Encounter: Payer: Self-pay | Admitting: Family Medicine

## 2014-01-14 VITALS — BP 124/82 | HR 64 | Temp 100.8°F | Wt 278.0 lb

## 2014-01-14 DIAGNOSIS — Z569 Unspecified problems related to employment: Secondary | ICD-10-CM

## 2014-01-14 DIAGNOSIS — J209 Acute bronchitis, unspecified: Secondary | ICD-10-CM

## 2014-01-14 DIAGNOSIS — Z566 Other physical and mental strain related to work: Secondary | ICD-10-CM

## 2014-01-14 MED ORDER — AMOXICILLIN 875 MG PO TABS: 875.0000 mg | ORAL_TABLET | Freq: Two times a day (BID) | ORAL | Status: DC

## 2014-01-14 NOTE — Progress Notes (Signed)
   Subjective:    Patient ID: Russell Sanford, male    DOB: 1955-05-04, 58 y.o.   MRN: [id number]  HPI Approximately 8 days ago he started having symptoms of cough, congestion, sore throat, fever, myalgias he continues with these symptoms however his cough is nonproductive. No earache. He has no seasonal allergies and does not smoke.  He also is under a lot of work-related stress. 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 He feels that his job is fairly secure but still feels under a lot of stress with new management and having to educate them about the company and the inner workings.  Review of Systems     Objective:   Physical Exam alert and in no distress. Tympanic membranes and canals are normal. Throat is clear. Tonsils are normal. Neck is supple without adenopathy or thyromegaly. Cardiac exam shows a regular sinus rhythm without murmurs or gallops. Lungs are clear to auscultation.        Assessment & Plan:  Acute bronchitis, unspecified organism - Plan: amoxicillin (AMOXIL) 875 MG tablet  He has a good feel for his job and potential for his survival there. Did discuss the need for him to make sure his resume is ready and keep benign on for a potential new opportunities.

## 2014-02-10 ENCOUNTER — Encounter: Payer: Self-pay | Admitting: Internal Medicine

## 2015-01-14 ENCOUNTER — Telehealth: Payer: Self-pay | Admitting: Family Medicine

## 2015-01-14 NOTE — Telephone Encounter (Signed)
Dima wife Donaldson Sink called, she said that [employer] come to there house about a month ago and took blood,checked his BP, and was suppose to send [place] the results, looked in the chart and didn't see anything. i told her to call them and see whats going on and to get them to send [place] the results and her a copy. She was very upset that the havent sent [place] anything, and was concerned that they might not have been [employer]. [place] Sink was going to call back and let me know what they said.

## 2015-02-22 ENCOUNTER — Ambulatory Visit (INDEPENDENT_AMBULATORY_CARE_PROVIDER_SITE_OTHER): Payer: BLUE CROSS/BLUE SHIELD | Admitting: Family Medicine

## 2015-02-22 ENCOUNTER — Encounter: Payer: Self-pay | Admitting: Family Medicine

## 2015-02-22 VITALS — BP 124/80 | HR 60 | Ht 72.0 in | Wt 280.6 lb

## 2015-02-22 DIAGNOSIS — K219 Gastro-esophageal reflux disease without esophagitis: Secondary | ICD-10-CM | POA: Diagnosis not present

## 2015-02-22 DIAGNOSIS — E785 Hyperlipidemia, unspecified: Secondary | ICD-10-CM

## 2015-02-22 DIAGNOSIS — Z86718 Personal history of other venous thrombosis and embolism: Secondary | ICD-10-CM | POA: Diagnosis not present

## 2015-02-22 DIAGNOSIS — M129 Arthropathy, unspecified: Secondary | ICD-10-CM

## 2015-02-22 DIAGNOSIS — Z8249 Family history of ischemic heart disease and other diseases of the circulatory system: Secondary | ICD-10-CM

## 2015-02-22 DIAGNOSIS — Z8679 Personal history of other diseases of the circulatory system: Secondary | ICD-10-CM

## 2015-02-22 DIAGNOSIS — Z86711 Personal history of pulmonary embolism: Secondary | ICD-10-CM | POA: Diagnosis not present

## 2015-02-22 DIAGNOSIS — K2270 Barrett's esophagus without dysplasia: Secondary | ICD-10-CM | POA: Diagnosis not present

## 2015-02-22 DIAGNOSIS — Z96652 Presence of left artificial knee joint: Secondary | ICD-10-CM

## 2015-02-22 DIAGNOSIS — Z23 Encounter for immunization: Secondary | ICD-10-CM | POA: Diagnosis not present

## 2015-02-22 DIAGNOSIS — Z0000 Encounter for general adult medical examination without abnormal findings: Secondary | ICD-10-CM | POA: Diagnosis not present

## 2015-02-22 DIAGNOSIS — Z1159 Encounter for screening for other viral diseases: Secondary | ICD-10-CM

## 2015-02-22 LAB — CBC WITH DIFFERENTIAL/PLATELET
BASOS PCT: 0 % (ref 0–1)
Basophils Absolute: 0 10*3/uL (ref 0.0–0.1)
EOS ABS: 0.1 10*3/uL (ref 0.0–0.7)
EOS PCT: 1 % (ref 0–5)
HEMATOCRIT: 45.4 % (ref 39.0–52.0)
HEMOGLOBIN: 16 g/dL (ref 13.0–17.0)
LYMPHS PCT: 24 % (ref 12–46)
Lymphs Abs: 1.7 10*3/uL (ref 0.7–4.0)
MCH: 33.1 pg (ref 26.0–34.0)
MCHC: 35.2 g/dL (ref 30.0–36.0)
MCV: 93.8 fL (ref 78.0–100.0)
MPV: 11 fL (ref 8.6–12.4)
Monocytes Absolute: 0.6 10*3/uL (ref 0.1–1.0)
Monocytes Relative: 8 % (ref 3–12)
Neutro Abs: 4.7 10*3/uL (ref 1.7–7.7)
Neutrophils Relative %: 67 % (ref 43–77)
Platelets: 233 10*3/uL (ref 150–400)
RBC: 4.84 MIL/uL (ref 4.22–5.81)
RDW: 13.3 % (ref 11.5–15.5)
WBC: 7 10*3/uL (ref 4.0–10.5)

## 2015-02-22 NOTE — Progress Notes (Signed)
Subjective:    Patient ID: Russell Sanford, male    DOB: 1955/09/14, 59 y.o.   MRN: [id number]  HPI He is here for a complete examination. He is considering having a right knee replacement but is having difficulty dealing with work related issues and being able to take time off. He has had left knee total knee replacement and is doing well with this. He is also had left shoulder surgery and replacement. Does have a family history of heart disease and has been seen by cardiology in the past. Presently he is having no chest pain, shortness breath, DOE or PND. His reflux is under good control with using Prilosec every other day. He does have a history of Barrett's esophagus but has not had this checked in several years. He does have a previous history of PE/DVT following surgery again at this time having no difficulty with chest pain or extremity swelling. He has a history of hyperlipidemia but presently is on no medications. He also has a history of ventricular tachycardia that this was probably related to his PE. His work is stressful. He is in the process of looking at alternatives. They made him sign a non-compete clause which has him worried. He would like to work for another 5 years. His marriage is going well. Family and social history as well as health maintenance and immunizations were reviewed.  Review of Systems  All other systems reviewed and are negative.      Objective:   Physical Exam BP 124/80 mmHg  Pulse 60  Ht 6' (1.829 m)  Wt 280 lb 9.6 oz (127.279 kg)  BMI 38.05 kg/m2  SpO2 98%  General Appearance:    Alert, cooperative, no distress, appears stated age  Head:    Normocephalic, without obvious abnormality, atraumatic  Eyes:    PERRL, conjunctiva/corneas clear, EOM's intact, fundi    benign  Ears:    Normal TM's and external ear canals  Nose:   Nares normal, mucosa normal, no drainage or sinus   tenderness  Throat:   Lips, mucosa, and tongue normal; teeth and gums normal    Neck:   Supple, no lymphadenopathy;  thyroid:  no   enlargement/tenderness/nodules; no carotid   bruit or JVD  Back:    Spine nontender, no curvature, ROM normal, no CVA     tenderness  Lungs:     Clear to auscultation bilaterally without wheezes, rales or     ronchi ; respirations unlabored  Chest Wall:    No tenderness or deformity   Heart:    Regular rate and rhythm, S1 and S2 normal, no murmur, rub   or gallop  Breast Exam:    No chest wall tenderness, masses or gynecomastia  Abdomen:     Soft, non-tender, nondistended, normoactive bowel sounds,    no masses, no hepatosplenomegaly        Extremities:   No clubbing, cyanosis or edema  Pulses:   2+ and symmetric all extremities  Skin:   Skin color, texture, turgor normal, no rashes or lesions  Lymph nodes:   Cervical, supraclavicular, and axillary nodes normal  Neurologic:   CNII-XII intact, normal strength, sensation and gait; reflexes 2+ and symmetric throughout          Psych:   Normal mood, affect, hygiene and grooming.          Assessment & Plan:  Routine general medical examination at a health care facility - Plan: CBC with Differential/Platelet, Comprehensive metabolic  panel, Lipid panel  Status post total left knee replacement  Arthropathy  Family history of heart disease in male family member before age 89 - Plan: CBC with Differential/Platelet, Comprehensive metabolic panel, Lipid panel  Gastroesophageal reflux disease without esophagitis  History of pulmonary embolus (PE)  History of ventricular tachycardia  Barrett's esophagus without dysplasia  History of DVT (deep vein thrombosis)  Hyperlipidemia LDL goal <100 - Plan: Lipid panel  Need for hepatitis C screening test - Plan: Hepatitis C antibody  Need for prophylactic vaccination and inoculation against influenza - Plan: Flu Vaccine QUAD 36+ mos IM  Need for shingles vaccine - Plan: Varicella-zoster vaccine subcutaneous discuss having the knee  replacement however at this time he will consider all options. He is to remain as physically active as possible. He will continue on his Prilosec. I will also check for hepatitis C. He will be discussing legal options with his lawyer concerning job opportunities.

## 2015-02-23 LAB — LIPID PANEL
CHOLESTEROL: 224 mg/dL — AB (ref 125–200)
HDL: 49 mg/dL (ref >=40)
LDL CALC: 113 mg/dL (ref <130)
TRIGLYCERIDES: 309 mg/dL — AB (ref <150)
Total CHOL/HDL Ratio: 4.6 Ratio (ref <=5.0)
VLDL: 62 mg/dL — ABNORMAL HIGH (ref <30)

## 2015-02-23 LAB — COMPREHENSIVE METABOLIC PANEL
ALBUMIN: 4.6 g/dL (ref 3.6–5.1)
ALT: 26 U/L (ref 9–46)
AST: 25 U/L (ref 10–35)
Alkaline Phosphatase: 72 U/L (ref 40–115)
BILIRUBIN TOTAL: 0.9 mg/dL (ref 0.2–1.2)
BUN: 13 mg/dL (ref 7–25)
CO2: 27 mmol/L (ref 20–31)
CREATININE: 0.8 mg/dL (ref 0.70–1.25)
Calcium: 9.9 mg/dL (ref 8.6–10.3)
Chloride: 102 mmol/L (ref 98–110)
Glucose, Bld: 93 mg/dL (ref 65–99)
Potassium: 4.2 mmol/L (ref 3.5–5.3)
Sodium: 140 mmol/L (ref 135–146)
TOTAL PROTEIN: 7.3 g/dL (ref 6.1–8.1)

## 2015-02-23 LAB — HEPATITIS C ANTIBODY: HCV Ab: NEGATIVE

## 2015-03-01 ENCOUNTER — Telehealth: Payer: Self-pay | Admitting: Family Medicine

## 2015-03-01 NOTE — Telephone Encounter (Signed)
I have talked to pt wife and explained the labs to her she verbalized understanding

## 2015-03-01 NOTE — Telephone Encounter (Signed)
Pt's wife, [place] SinkRita, called stating that received lab results and they are trying to understand them. Specifically she wants to know if pt's liver enzymes were tested.

## 2016-09-04 ENCOUNTER — Other Ambulatory Visit: Payer: Self-pay | Admitting: Orthopaedic Surgery

## 2016-09-04 DIAGNOSIS — M75101 Unspecified rotator cuff tear or rupture of right shoulder, not specified as traumatic: Secondary | ICD-10-CM

## 2016-09-18 ENCOUNTER — Ambulatory Visit
Admission: RE | Admit: 2016-09-18 | Discharge: 2016-09-18 | Disposition: A | Discharge status: Home / Self Care | Payer: Worker's Compensation | Source: Ambulatory Visit | Attending: Orthopaedic Surgery | Admitting: Orthopaedic Surgery

## 2016-09-18 DIAGNOSIS — M75101 Unspecified rotator cuff tear or rupture of right shoulder, not specified as traumatic: Secondary | ICD-10-CM

## 2016-10-16 ENCOUNTER — Emergency Department (HOSPITAL_COMMUNITY)
Admission: EM | Admit: 2016-10-16 | Discharge: 2016-10-16 | Disposition: A | Discharge status: Home / Self Care | Payer: Worker's Compensation | Attending: Emergency Medicine | Admitting: Emergency Medicine

## 2016-10-16 ENCOUNTER — Encounter (HOSPITAL_COMMUNITY): Payer: Self-pay | Admitting: Neurology

## 2016-10-16 ENCOUNTER — Emergency Department (HOSPITAL_COMMUNITY): Payer: Worker's Compensation

## 2016-10-16 DIAGNOSIS — R0602 Shortness of breath: Secondary | ICD-10-CM | POA: Diagnosis present

## 2016-10-16 DIAGNOSIS — Z79899 Other long term (current) drug therapy: Secondary | ICD-10-CM | POA: Diagnosis not present

## 2016-10-16 DIAGNOSIS — R110 Nausea: Secondary | ICD-10-CM | POA: Diagnosis not present

## 2016-10-16 DIAGNOSIS — K5903 Drug induced constipation: Secondary | ICD-10-CM | POA: Diagnosis not present

## 2016-10-16 DIAGNOSIS — R42 Dizziness and giddiness: Secondary | ICD-10-CM

## 2016-10-16 DIAGNOSIS — R17 Unspecified jaundice: Secondary | ICD-10-CM | POA: Diagnosis not present

## 2016-10-16 LAB — COMPREHENSIVE METABOLIC PANEL
ALBUMIN: 4.4 g/dL (ref 3.5–5.0)
ALK PHOS: 66 U/L (ref 38–126)
ALT: 62 U/L (ref 17–63)
AST: 49 U/L — AB (ref 15–41)
Anion gap: 11 (ref 5–15)
BILIRUBIN TOTAL: 2.3 mg/dL — AB (ref 0.3–1.2)
BUN: 12 mg/dL (ref 6–20)
CALCIUM: 9.6 mg/dL (ref 8.9–10.3)
CO2: 22 mmol/L (ref 22–32)
CREATININE: 0.92 mg/dL (ref 0.61–1.24)
Chloride: 102 mmol/L (ref 101–111)
GFR calc Af Amer: >60 mL/min (ref >60)
GLUCOSE: 101 mg/dL — AB (ref 65–99)
POTASSIUM: 3.9 mmol/L (ref 3.5–5.1)
Sodium: 135 mmol/L (ref 135–145)
Total Protein: 7.1 g/dL (ref 6.5–8.1)

## 2016-10-16 LAB — CBC WITH DIFFERENTIAL/PLATELET
BASOS ABS: 0 10*3/uL (ref 0.0–0.1)
Basophils Relative: 0 %
Eosinophils Absolute: 0.2 10*3/uL (ref 0.0–0.7)
Eosinophils Relative: 3 %
HCT: 46 % (ref 39.0–52.0)
HEMOGLOBIN: 16.2 g/dL (ref 13.0–17.0)
Lymphocytes Relative: 19 %
Lymphs Abs: 1.1 10*3/uL (ref 0.7–4.0)
MCH: 33.3 pg (ref 26.0–34.0)
MCHC: 35.2 g/dL (ref 30.0–36.0)
MCV: 94.5 fL (ref 78.0–100.0)
MONOS PCT: 13 %
Monocytes Absolute: 0.8 10*3/uL (ref 0.1–1.0)
NEUTROS PCT: 65 %
Neutro Abs: 3.7 10*3/uL (ref 1.7–7.7)
Platelets: 190 10*3/uL (ref 150–400)
RBC: 4.87 MIL/uL (ref 4.22–5.81)
RDW: 11.9 % (ref 11.5–15.5)
WBC: 5.6 10*3/uL (ref 4.0–10.5)

## 2016-10-16 LAB — I-STAT TROPONIN, ED: TROPONIN I, POC: 0 ng/mL (ref 0.00–0.08)

## 2016-10-16 LAB — BRAIN NATRIURETIC PEPTIDE: B Natriuretic Peptide: 25.8 pg/mL (ref 0.0–100.0)

## 2016-10-16 MED ORDER — SODIUM CHLORIDE 0.9 % IV BOLUS (SEPSIS)
1000.0000 mL | Freq: Once | INTRAVENOUS | Status: AC
Start: 2016-10-16 — End: 2016-10-16
  Administered 2016-10-16: 1000 mL via INTRAVENOUS

## 2016-10-16 MED ORDER — IOPAMIDOL (ISOVUE-370) INJECTION 76%
100.0000 mL | Freq: Once | INTRAVENOUS | Status: AC | PRN
  Administered 2016-10-16: 100 mL via INTRAVENOUS

## 2016-10-16 MED ORDER — ONDANSETRON 4 MG PO TBDP: 4.0000 mg | ORAL_TABLET | Freq: Three times a day (TID) | ORAL | 0 refills | Status: DC | PRN

## 2016-10-16 MED ORDER — IOPAMIDOL (ISOVUE-370) INJECTION 76%
INTRAVENOUS | Status: AC
  Filled 2016-10-16: qty 100

## 2016-10-16 MED ORDER — POLYETHYLENE GLYCOL 3350 17 G PO PACK: 17.0000 g | PACK | Freq: Every day | ORAL | 0 refills | Status: DC

## 2016-10-16 NOTE — ED Provider Notes (Signed)
[facility]-EMERGENCY DEPT Provider Note   CSN: [id number] Arrival date & time: 10/16/16  1102     History   Chief Complaint Chief Complaint  Patient presents with  . Shortness of Breath    HPI Russell Sanford is a 61 y.o. male with a PMHx of DVT and PE after L knee replacement surgery in 2014, HLD, GERD, hiatal hernia, barrett's esophagus, and arthritis, who presents to the ED via EMS with complaints of sudden onset shortness of breath that woke him up from rest around 5:30 AM. He states that he has a history of PE after surgery, had surgery on 10/11/16 for his right shoulder (arthroscopy) and was not put on blood thinners because his surgeon stated that it's rare to have a blood clot after arthroscopy. He came to the ED for evaluation of his shortness breath because he was worried that it could be a PE like he had in 2014. After that PE, he states that he had one year of Xarelto and then was discontinued afterwards. Today he reports associated lightheadedness but worsened when he tried to stand up and improved slightly when he laid down, as well as diaphoresis. He also reports intermittent nausea over the last few days as well as constipation with no bowel movement since Tuesday 6/26 which he relates to his oxycodone use. He used 2 Ex-Lax doses with no relief of constipation. He is a nonsmoker. His PCP is Dr. Sharlot GowdaJohn Sanford.   He denies fevers, chills, CP, cough, LE or UE swelling, recent travel or prolonged immobilization, abd pain, vomiting, diarrhea, obstipation, rectal pain/bleeding, hematuria, dysuria, myalgias, arthralgias, numbness, tingling, focal weakness, claudication, orthopnea, or any other complaints at this time.    The history is provided by the patient and medical records. No language interpreter was used.  Shortness of Breath  This is a new problem. The average episode lasts 7 hours. The problem occurs continuously.The current episode started 6 to 12 hours ago. The problem has not  changed since onset.Pertinent negatives include no fever, no cough, no orthopnea, no chest pain, no vomiting, no abdominal pain, no leg pain, no leg swelling and no claudication. It is unknown what precipitated the problem. He has tried nothing for the symptoms. The treatment provided no relief. He has had prior hospitalizations. Associated medical issues include PE and DVT.    Past Medical History:  Diagnosis Date  . Arthritis   . Barrett's esophagus   . DVT (deep venous thrombosis) (HCC)    a. Posterior tibial vein DVT 05/2012 following L TKA.  Marland Kitchen. Dysrhythmia   . GERD (gastroesophageal reflux disease)   . HH (hiatus hernia)   . Hypercholesteremia   . Pulmonary embolism (HCC)    a. Large burden bilat PE on CTA 05/2012 following L TKA. F/U CT 07/02/12 showed recanalization of the vessels involved with PE    Patient Active Problem List   Diagnosis Date Noted  . History of ventricular tachycardia 07/08/2012  . History of DVT (deep vein thrombosis) 05/30/2012  . History of pulmonary embolus (PE) 05/29/2012  . S/P total knee arthroplasty 05/29/2012  . Family history of heart disease in male family member before age 63 05/02/2011  . Hyperlipidemia LDL goal <100 05/02/2011  . Arthropathy 04/21/2010  . NONSPEC ELEVATION OF LEVELS OF TRANSAMINASE/LDH 04/21/2010  . INTERNAL HEMORRHOIDS 04/20/2010  . GERD 04/20/2010  . Barrett's esophagus 04/20/2010    Past Surgical History:  Procedure Laterality Date  . FRACTURE SURGERY     left lower  leg  . KNEE ARTHROSCOPY     bilateral  . LASIK    . SHOULDER ARTHROSCOPY     bilateral  . TOE AMPUTATION     left big toe  . TOTAL KNEE ARTHROPLASTY  05/17/2012   Procedure: TOTAL KNEE ARTHROPLASTY;  Surgeon: Eugenia Mcalpine, MD;  Location: [facility];  Service: Orthopedics;  Laterality: Left;  . TOTAL SHOULDER ARTHROPLASTY Left 08/29/2012   Procedure: LEFT SHOULDER TOTAL ARTHROPLASTY;  Surgeon: Senaida Lange, MD;  Location: [facility] OR;  Service: Orthopedics;   Laterality: Left;  . TOTAL SHOULDER REPLACEMENT Left 08/29/2012   Dr Rennis Chris       Home Medications    Prior to Admission medications   Medication Sig Start Date End Date Taking? Authorizing Provider  omeprazole (PRILOSEC OTC) 20 MG tablet Take 20 mg by mouth every morning.     [provider]    Family History Family History  Problem Relation Age of Onset  . Heart attack Father 56       Father died at age 72 of massive MI  . Arrhythmia Mother        Scarlet fever -> "fibrillation"    Social History Social History  Substance Use Topics  . Smoking status: Never Smoker  . Smokeless tobacco: Never Used  . Alcohol use No     Comment: used to drink heavily, quit January 2014     Allergies   Sulfonamide derivatives   Review of Systems Review of Systems  Constitutional: Positive for diaphoresis. Negative for chills and fever.  Respiratory: Positive for shortness of breath. Negative for cough.   Cardiovascular: Negative for chest pain, orthopnea, claudication and leg swelling.  Gastrointestinal: Positive for constipation and nausea. Negative for abdominal pain, anal bleeding, diarrhea, rectal pain and vomiting.  Genitourinary: Negative for dysuria and hematuria.  Musculoskeletal: Negative for arthralgias and myalgias.  Skin: Negative for color change.  Allergic/Immunologic: Negative for immunocompromised state.  Neurological: Positive for light-headedness. Negative for weakness and numbness.  Psychiatric/Behavioral: Negative for confusion.   All other systems reviewed and are negative for acute change except as noted in the HPI.    Physical Exam Updated Vital Signs BP 126/68 (BP Location: Left Arm)   Pulse 70   Temp 98.4 F (36.9 C) (Oral)   Resp 18   Ht 6\' 1"  (1.854 m)   Wt 133.8 kg (295 lb)   SpO2 97%   BMI 38.92 kg/m   Physical Exam  Constitutional: He is oriented to person, place, and time. Vital signs are normal. He appears well-developed and  well-nourished.  Non-toxic appearance. No distress.  Afebrile, nontoxic, NAD  HENT:  Head: Normocephalic and atraumatic.  Mouth/Throat: Oropharynx is clear and moist and mucous membranes are normal.  Eyes: Conjunctivae and EOM are normal. Right eye exhibits no discharge. Left eye exhibits no discharge.  Neck: Normal range of motion. Neck supple.  Cardiovascular: Normal rate, regular rhythm, normal heart sounds and intact distal pulses.  Exam reveals no gallop and no friction rub.   No murmur heard. RRR, nl s1/s2, no m/r/g, distal pulses intact, no pedal edema   Pulmonary/Chest: Effort normal and breath sounds normal. No respiratory distress. He has no decreased breath sounds. He has no wheezes. He has no rhonchi. He has no rales.  CTAB in all lung fields, no w/r/r, no hypoxia or increased WOB, speaking in full sentences, SpO2 97% on RA   Abdominal: Soft. Normal appearance and bowel sounds are normal. He exhibits no distension.  There is no tenderness. There is no rigidity, no rebound, no guarding, no CVA tenderness, no tenderness at McBurney's point and negative Murphy's sign.  Soft, NTND, +BS throughout, no r/g/r, neg murphy's, neg mcburney's, no CVA TTP   Genitourinary:  Genitourinary Comments: Pt declined DRE  Musculoskeletal: Normal range of motion.  MAE x4 except R shoulder in sling but bends elbow and wiggles fingers on RUE without difficulty Strength and sensation grossly intact in all extremities Distal pulses intact Gait steady No pedal edema, neg homan's bilaterally, no upper extremity edema  Neurological: He is alert and oriented to person, place, and time. He has normal strength. No sensory deficit.  Skin: Skin is warm, dry and intact. No rash noted.  Psychiatric: He has a normal mood and affect.  Nursing note and vitals reviewed.    ED Treatments / Results  Labs (all labs ordered are listed, but only abnormal results are displayed) Labs Reviewed  COMPREHENSIVE METABOLIC  PANEL - Abnormal; Notable for the following:       Result Value   Glucose, Bld 101 (*)    AST 49 (*)    Total Bilirubin 2.3 (*)    All other components within normal limits  CBC WITH DIFFERENTIAL/PLATELET  BRAIN NATRIURETIC PEPTIDE  I-STAT TROPOININ, ED    EKG  EKG Interpretation  Date/Time:  Monday October 16 2016 11:21:13 EDT Ventricular Rate:  69 PR Interval:    QRS Duration: 92 QT Interval:  407 QTC Calculation: 436 R Axis:   -29 Text Interpretation:  Sinus rhythm Baseline wander in lead(s) I III aVL Poor data quality in current ECG precludes serial comparison No significant change since last tracing Confirmed by Linwood Dibbles [phone]) on 10/16/2016 11:23:57 AM       Radiology Ct Angio Chest Pe W And/or Wo Contrast  Result Date: 10/16/2016 CLINICAL DATA:  Shortness of Breath EXAM: CT ANGIOGRAPHY CHEST WITH CONTRAST TECHNIQUE: Multidetector CT imaging of the chest was performed using the standard protocol during bolus administration of intravenous contrast. Multiplanar CT image reconstructions and MIPs were obtained to evaluate the vascular anatomy. CONTRAST:  100 mL Isovue 370. COMPARISON:  Chest x-ray from earlier in the same day. FINDINGS: Cardiovascular: Thoracic aorta demonstrates no significant aneurysmal dilatation or dissection. No significant atherosclerotic calcifications are seen. Coronary calcifications are noted. Mild cardiac enlargement is seen. The pulmonary artery demonstrates a normal branching pattern. No focal filling defects are identified to suggest pulmonary emboli. Mediastinum/Nodes: Thoracic inlet is within normal limits. No significant mediastinal adenopathy is noted. Scattered small hilar lymph nodes are noted particularly on the right. The esophagus as visualized is within normal limits. Lungs/Pleura: The lungs are well aerated bilaterally. Mild atelectatic changes are noted without focal infiltrate. No sizable effusion is seen. Upper Abdomen: Within normal limits.  Musculoskeletal: Left shoulder replacement is noted. Degenerative changes of the thoracic spine are seen. No compression deformities are noted. Review of the MIP images confirms the above findings. IMPRESSION: No evidence of pulmonary emboli. Small likely reactive hilar lymph nodes. Mild atelectatic changes in the bases.  No focal infiltrate is seen. Electronically Signed   By: Alcide Clever M.D.   On: 10/16/2016 14:23   Dg Abd Acute W/chest  Result Date: 10/16/2016 CLINICAL DATA:  Shortness of Breath EXAM: DG ABDOMEN ACUTE W/ 1V CHEST COMPARISON:  None. FINDINGS: Cardiac shadow is within normal limits. The lungs are well aerated bilaterally. No focal infiltrate or sizable effusion is seen. Scattered large and small bowel gas is noted. Mild retained  fecal material is noted without obstructive change. Mild degenerative changes of lumbar spine are seen. No abnormal mass or abnormal calcifications are noted. No free air is noted. IMPRESSION: No acute abnormality noted. Electronically Signed   By: Alcide Clever M.D.   On: 10/16/2016 12:09    Procedures Procedures (including critical care time)  Medications Ordered in ED Medications  iopamidol (ISOVUE-370) 76 % injection (not administered)  sodium chloride 0.9 % bolus 1,000 mL (1,000 mLs Intravenous New Bag/Given 10/16/16 1324)  iopamidol (ISOVUE-370) 76 % injection 100 mL (100 mLs Intravenous Contrast Given 10/16/16 1350)     Initial Impression / Assessment and Plan / ED Course  I have reviewed the triage vital signs and the nursing notes.  Pertinent labs & imaging results that were available during my care of the patient were reviewed by me and considered in my medical decision making (see chart for details).     61 y.o. male here with SOB and lightheadedness this morning, just had surgery 10/11/16 and has hx of PE after surgery in 2014. Had some nausea a few times over the last few days, and diaphoresis during the episode this morning. Also hasn't had  BM since day before surgery, however he states this is related to his oxycodone use and he declines rectal exam. Denies any abd pain or tenderness on exam. No CP complaint. Clear lungs, no hypoxia or tachycardia, no extremity swelling. EKG without acute ischemic findings. Given moderate risk for PE, will proceed with CTA to r/o PE. Will also get labs and get acute abd series to see if constipation is causing blockage and causing his symptoms. Will reassess shortly  1:12 PM CBC w/diff WNL. CMP with AST 49 and bili 2.3, but otherwise unremarkable; will give fluids, could just be from dehydration; no abdominal tenderness, doubt need for RUQ U/S. BNP 25.8 which is reassuring. Trop negative. Acute abd series with scattered gas and stool burden but no obstruction or acute abnormalities seen.  CTA pending, will await results of this and reassess shortly.   3:15 PM CTA without evidence of PE, some reactive hilar LAD, and mild atelectatic changes in the bases but no evidence of infiltrates. Overall reassuring work up today; symptoms could be related to medications, vs dehydration, vs other nonemergent etiologies. Will rx miralax and zofran, advised increased fiber and water intake in diet, discussed staying hydrated, and f/up with PCP in 5-7 days for recheck. I explained the diagnosis and have given explicit precautions to return to the ER including for any other new or worsening symptoms. The patient understands and accepts the medical plan as it's been dictated and I have answered their questions. Discharge instructions concerning home care and prescriptions have been given. The patient is STABLE and is discharged to home in good condition.    Final Clinical Impressions(s) / ED Diagnoses   Final diagnoses:  SOB (shortness of breath)  Episodic lightheadedness  Drug-induced constipation  Elevated bilirubin  Nausea    New Prescriptions New Prescriptions   ONDANSETRON (ZOFRAN ODT) 4 MG DISINTEGRATING  TABLET    Take 1 tablet (4 mg total) by mouth every 8 (eight) hours as needed for nausea or vomiting.   POLYETHYLENE GLYCOL (MIRALAX / GLYCOLAX) PACKET    Take 17 g by mouth daily.     [address], [employer], [place] 10/16/16 1520    Linwood Dibbles, MD 10/16/16 [phone]

## 2016-10-16 NOTE — ED Triage Notes (Signed)
Per ems- had shoulder surgery on Wednesday. History of blood clots, PE. He woke up this morning feeling SOB. Lungs clear, 97% RA. Is a x 4. EKG unremarkable. Was placed on 2 L [place], for SOB. Has been taking oxycodone for pain, this morning. Denies CP, has SOB and dizziness when standing. BP 142/92.

## 2016-10-16 NOTE — Discharge Instructions (Signed)
Your work up today has been reassuring today, except for your bilirubin level being slightly elevated which can be from dehydration. Your symptoms could be related to medications you're on after your surgery, from dehydration, or from other nonemergent causes. Stay well hydrated with plenty of water. Get plenty of rest. Use miralax as directed to help with constipation, and increase the fiber/water intake in your diet. Use zofran as directed as needed for nausea. Follow up with your regular doctor in 5-7 days for recheck of symptoms. Return to the ER for emergent changes or worsening symptoms.

## 2016-10-16 NOTE — ED Notes (Signed)
ED Provider at bedside. 

## 2017-10-22 ENCOUNTER — Encounter: Payer: BLUE CROSS/BLUE SHIELD | Admitting: Family Medicine

## 2019-07-25 ENCOUNTER — Ambulatory Visit: Payer: Self-pay | Attending: Internal Medicine

## 2019-07-25 DIAGNOSIS — Z23 Encounter for immunization: Secondary | ICD-10-CM

## 2019-07-25 NOTE — Progress Notes (Signed)
   Covid-19 Vaccination Clinic  Name:  Russell Sanford    MRN: [id number] DOB: [birth year]  07/25/2019  Mr. [name] was observed post Covid-19 immunization for 15 minutes without incident. He was provided with Vaccine Information Sheet and instruction to access the V-Safe system.   Mr. [name] was instructed to call 911 with any severe reactions post vaccine: Marland Kitchen Difficulty breathing  . Swelling of face and throat  . A fast heartbeat  . A bad rash all over body  . Dizziness and weakness   Immunizations Administered    Name Date Dose VIS Date Route   Pfizer COVID-19 Vaccine 07/25/2019  8:56 AM 0.3 mL 03/28/2019 Intramuscular   Manufacturer: [employer], [employer]   Lot: [id number]   NDC: [address]-8

## 2019-08-18 ENCOUNTER — Ambulatory Visit: Payer: Self-pay | Attending: Internal Medicine

## 2019-08-18 DIAGNOSIS — Z23 Encounter for immunization: Secondary | ICD-10-CM

## 2019-08-18 NOTE — Progress Notes (Signed)
   Covid-19 Vaccination Clinic  Name:  Russell Sanford    MRN: [id number] DOB: 01-06-1956  08/18/2019  [name] was observed post Covid-19 immunization for 15 minutes without incident. He was provided with Vaccine Information Sheet and instruction to access the V-Safe system.   [name] was instructed to call 911 with any severe reactions post vaccine: Marland Kitchen Difficulty breathing  . Swelling of face and throat  . A fast heartbeat  . A bad rash all over body  . Dizziness and weakness   Immunizations Administered    Name Date Dose VIS Date Route   Pfizer COVID-19 Vaccine 08/18/2019 10:22 AM 0.3 mL 06/11/2018 Intramuscular   Manufacturer: [employer], [employer]   Lot: [id number]   NDC: [address]-4

## 2019-11-21 ENCOUNTER — Telehealth: Payer: Self-pay | Admitting: Internal Medicine

## 2019-11-21 NOTE — Telephone Encounter (Signed)
LM for pt to call back to schedule an annual wellness

## 2020-02-13 ENCOUNTER — Ambulatory Visit (INDEPENDENT_AMBULATORY_CARE_PROVIDER_SITE_OTHER): Payer: Medicare Other | Admitting: Family Medicine

## 2020-02-13 ENCOUNTER — Other Ambulatory Visit: Payer: Self-pay

## 2020-02-13 VITALS — BP 134/88 | HR 76 | Temp 97.5°F | Ht 71.0 in | Wt 277.6 lb

## 2020-02-13 DIAGNOSIS — Z8249 Family history of ischemic heart disease and other diseases of the circulatory system: Secondary | ICD-10-CM | POA: Diagnosis not present

## 2020-02-13 DIAGNOSIS — K219 Gastro-esophageal reflux disease without esophagitis: Secondary | ICD-10-CM

## 2020-02-13 DIAGNOSIS — Z8679 Personal history of other diseases of the circulatory system: Secondary | ICD-10-CM | POA: Diagnosis not present

## 2020-02-13 DIAGNOSIS — Z23 Encounter for immunization: Secondary | ICD-10-CM | POA: Diagnosis not present

## 2020-02-13 DIAGNOSIS — Z0000 Encounter for general adult medical examination without abnormal findings: Secondary | ICD-10-CM

## 2020-02-13 DIAGNOSIS — Z86718 Personal history of other venous thrombosis and embolism: Secondary | ICD-10-CM

## 2020-02-13 DIAGNOSIS — E785 Hyperlipidemia, unspecified: Secondary | ICD-10-CM

## 2020-02-13 DIAGNOSIS — Z96652 Presence of left artificial knee joint: Secondary | ICD-10-CM

## 2020-02-13 DIAGNOSIS — K2270 Barrett's esophagus without dysplasia: Secondary | ICD-10-CM

## 2020-02-13 DIAGNOSIS — Z86711 Personal history of pulmonary embolism: Secondary | ICD-10-CM

## 2020-02-13 LAB — LIPID PANEL

## 2020-02-13 NOTE — Progress Notes (Signed)
Russell Sanford is a 64 y.o. male who presents for his welcome to medicare  visit and follow-up on chronic medical conditions.  He has no particular concerns or complaints.  He does have reflux disease and also history of Barrett's esophagus and does follow-up with GI appropriately.  He has a previous history of left knee TKR and subsequent DVT/PE and V. tach while this was occurring.  He did well on Xarelto.  Apparently he has another TKR coming up and will stay on his Xarelto.  There is a family history of heart disease.  His last colonoscopy was in 2012.  He does not smoke or drink.  His marriage is going well.  He continues to work.   Immunizations and Health Maintenance Immunization History  Administered Date(s) Administered  . Influenza Split 02/24/2013, 02/07/2014  . Influenza, Seasonal, Injecte, Preservative Fre 03/13/2012  . Influenza,inj,Quad PF,6+ Mos 02/22/2015  . PFIZER SARS-COV-2 Vaccination 07/25/2019, 08/18/2019  . Tdap 03/13/2012   Health Maintenance Due  Topic Date Due  . HIV Screening  Never done  . PNA vac Low Risk Adult (1 of 2 - PCV13) Never done  . INFLUENZA VACCINE  11/16/2019    Last colonoscopy:04/2010 Last PSA: 05/02/11 Dentist: Q six months  Ophtho: over five years Exercise: working 5 days a week   Other doctors caring for patient include: N/A  Advanced Directives: Does Patient Have a Programmer, multimedia?: Yes Type of Advance Directive: Living will  Depression screen:  See questionnaire below.     Depression screen Russell Sanford 2/9 02/13/2020 02/22/2015 07/08/2012  Decreased Interest 0 0 3  Down, Depressed, Hopeless 0 0 3  PHQ - 2 Score 0 0 6    Fall Screen: See Questionaire below.   Fall Risk  02/13/2020 02/22/2015  Falls in the past year? 0 No  Risk for fall due to : No Fall Risks -    ADL screen:  See questionnaire below.  Functional Status Survey: Is the patient deaf or have difficulty hearing?: No Does the patient have difficulty seeing, even  when wearing glasses/contacts?: No Does the patient have difficulty concentrating, remembering, or making decisions?: No Does the patient have difficulty walking or climbing stairs?: No Does the patient have difficulty dressing or bathing?: No Does the patient have difficulty doing errands alone such as visiting a doctor's office or shopping?: No   Review of Systems  Constitutional: -, -unexpected weight change, -anorexia, -fatigue Allergy: -sneezing, -itching, -congestion Dermatology: denies changing moles, rash, lumps ENT: -runny nose, -ear pain, -sore throat,  Cardiology:  -chest pain, -palpitations, -orthopnea, Respiratory: -cough, -shortness of breath, -dyspnea on exertion, -wheezing,  Gastroenterology: -abdominal pain, -nausea, -vomiting, -diarrhea, -constipation, -dysphagia Hematology: -bleeding or bruising problems Musculoskeletal: -arthralgias, -myalgias, -joint swelling, -back pain, - Ophthalmology: -vision changes,  Urology: -dysuria, -difficulty urinating,  -urinary frequency, -urgency, incontinence Neurology: -, -numbness, , -memory loss, -falls, -dizziness    PHYSICAL EXAM: General Appearance: Alert, cooperative, no distress, appears stated age Head: Normocephalic, without obvious abnormality, atraumatic Eyes: PERRL, conjunctiva/corneas clear, EOM's intact, Ears: Normal TM's and external ear canals Nose: Nares normal, mucosa normal, no drainage or sinus   tenderness Throat: Lips, mucosa, and tongue normal; teeth and gums normal Neck: Supple, no lymphadenopathy, thyroid:no enlargement/tenderness/nodules; no carotid bruit or JVD Lungs: Clear to auscultation bilaterally without wheezes, rales or ronchi; respirations unlabored Heart: Regular rate and rhythm, S1 and S2 normal, no murmur, rub or gallop Abdomen: Soft, non-tender, nondistended, normoactive bowel sounds, no masses, no hepatosplenomegaly Pulses: 2+ and  symmetric all extremities Skin: Skin color, texture,  turgor normal, no rashes or lesions Lymph nodes: Cervical, supraclavicular, and axillary nodes normal Neurologic: CNII-XII intact, normal strength, sensation and gait; reflexes 2+ and symmetric throughout   Psych: Normal mood, affect, hygiene and grooming  ASSESSMENT/PLAN: Immunization, viral disease - Plan: Pfizer SARS-COV-2 Vaccine  History of DVT (deep vein thrombosis)  History of ventricular tachycardia  Status post total left knee replacement  Family history of heart disease in male family member before age 66 - Plan: Comprehensive metabolic panel, CBC with Differential/Platelet, Lipid panel  Hyperlipidemia LDL goal <100 - Plan: Lipid panel  Gastroesophageal reflux disease without esophagitis  Barrett's esophagus without dysplasia  Need for influenza vaccination - Plan: Flu Vaccine QUAD High Dose(Fluad)  Need for vaccination against Streptococcus pneumoniae - Plan: Pneumococcal conjugate vaccine 13-valent IM  History of pulmonary embolus (PE) He will continue to be followed by GI.  Discussed the fact that he will probably be placed on a statin drug.  His immunizations were updated.  Immunization recommendations discussed.  Colonoscopy recommendations reviewed.   Medicare Attestation I have personally reviewed: The patient's medical and social history Their use of alcohol, tobacco or illicit drugs Their current medications and supplements The patient's functional ability including ADLs,fall risks, home safety risks, cognitive, and hearing and visual impairment Diet and physical activities Evidence for depression or mood disorders  The patient's weight, height, and BMI have been recorded in the chart.  I have made referrals, counseling, and provided education to the patient based on review of the above and I have provided the patient with a written personalized care plan for preventive services.     Sharlot Gowda, MD   02/13/2020

## 2020-02-14 LAB — COMPREHENSIVE METABOLIC PANEL
ALT: 25 IU/L (ref 0–44)
AST: 26 IU/L (ref 0–40)
Albumin/Globulin Ratio: 1.6 (ref 1.2–2.2)
Albumin: 4.7 g/dL (ref 3.8–4.8)
Alkaline Phosphatase: 84 IU/L (ref 44–121)
BUN/Creatinine Ratio: 9 — ABNORMAL LOW (ref 10–24)
BUN: 9 mg/dL (ref 8–27)
Bilirubin Total: 1 mg/dL (ref 0.0–1.2)
CO2: 23 mmol/L (ref 20–29)
Calcium: 9.9 mg/dL (ref 8.6–10.2)
Chloride: 100 mmol/L (ref 96–106)
Creatinine, Ser: 0.97 mg/dL (ref 0.76–1.27)
GFR calc Af Amer: 94 mL/min/{1.73_m2} (ref >59)
GFR calc non Af Amer: 82 mL/min/{1.73_m2} (ref >59)
Globulin, Total: 2.9 g/dL (ref 1.5–4.5)
Glucose: 103 mg/dL — ABNORMAL HIGH (ref 65–99)
Potassium: 4.7 mmol/L (ref 3.5–5.2)
Sodium: 138 mmol/L (ref 134–144)
Total Protein: 7.6 g/dL (ref 6.0–8.5)

## 2020-02-14 LAB — CBC WITH DIFFERENTIAL/PLATELET
Basophils Absolute: 0 10*3/uL (ref 0.0–0.2)
Basos: 0 %
EOS (ABSOLUTE): 0.1 10*3/uL (ref 0.0–0.4)
Eos: 1 %
Hematocrit: 48.1 % (ref 37.5–51.0)
Hemoglobin: 16.5 g/dL (ref 13.0–17.7)
Immature Grans (Abs): 0 10*3/uL (ref 0.0–0.1)
Immature Granulocytes: 0 %
Lymphocytes Absolute: 1.3 10*3/uL (ref 0.7–3.1)
Lymphs: 25 %
MCH: 32.5 pg (ref 26.6–33.0)
MCHC: 34.3 g/dL (ref 31.5–35.7)
MCV: 95 fL (ref 79–97)
Monocytes Absolute: 0.6 10*3/uL (ref 0.1–0.9)
Monocytes: 12 %
Neutrophils Absolute: 3.2 10*3/uL (ref 1.4–7.0)
Neutrophils: 62 %
Platelets: 235 10*3/uL (ref 150–450)
RBC: 5.08 x10E6/uL (ref 4.14–5.80)
RDW: 12.2 % (ref 11.6–15.4)
WBC: 5.3 10*3/uL (ref 3.4–10.8)

## 2020-02-14 LAB — LIPID PANEL
Chol/HDL Ratio: 3.8 ratio (ref 0.0–5.0)
Cholesterol, Total: 244 mg/dL — ABNORMAL HIGH (ref 100–199)
HDL: 64 mg/dL (ref >39)
LDL Chol Calc (NIH): 152 mg/dL — ABNORMAL HIGH (ref 0–99)
Triglycerides: 155 mg/dL — ABNORMAL HIGH (ref 0–149)
VLDL Cholesterol Cal: 28 mg/dL (ref 5–40)

## 2020-02-15 MED ORDER — ATORVASTATIN CALCIUM 20 MG PO TABS: 20.0000 mg | ORAL_TABLET | Freq: Every day | ORAL | 3 refills | Status: DC

## 2020-02-15 NOTE — Addendum Note (Signed)
Addended by: Ronnald Nian on: 02/15/2020 08:35 PM   Modules accepted: Orders

## 2020-02-16 ENCOUNTER — Telehealth: Payer: Self-pay

## 2020-02-16 NOTE — Telephone Encounter (Signed)
Lvm for pt to call back for labs . KH

## 2020-06-21 ENCOUNTER — Telehealth: Payer: Self-pay

## 2020-06-21 NOTE — Telephone Encounter (Signed)
Pt. Called stating that when he saw Dr. Susann Givens for his CPE they discussed sending in some Viagra he wanted to know if you could send that in for him.

## 2020-06-21 NOTE — Telephone Encounter (Signed)
Pt. Scheduled with Dr. Susann Givens on 07/01/20.

## 2020-06-21 NOTE — Telephone Encounter (Signed)
I looked in the record.  I don't see any discussion about that type of medication, don't see prior script for this either.    2 choices, we can do a visit and discuss with me this week, or he can wait til next week when Dr. Susann Givens is back in the office.  This just happens to be a week Dr. Susann Givens is on vacation all week.

## 2020-07-01 ENCOUNTER — Ambulatory Visit: Payer: Medicare Other | Admitting: Family Medicine

## 2020-07-05 ENCOUNTER — Ambulatory Visit (INDEPENDENT_AMBULATORY_CARE_PROVIDER_SITE_OTHER): Payer: Medicare Other | Admitting: Family Medicine

## 2020-07-05 ENCOUNTER — Encounter: Payer: Self-pay | Admitting: Family Medicine

## 2020-07-05 ENCOUNTER — Other Ambulatory Visit: Payer: Self-pay

## 2020-07-05 VITALS — BP 124/82 | HR 83 | Temp 98.5°F | Wt 275.2 lb

## 2020-07-05 DIAGNOSIS — N5201 Erectile dysfunction due to arterial insufficiency: Secondary | ICD-10-CM | POA: Diagnosis not present

## 2020-07-05 DIAGNOSIS — Z789 Other specified health status: Secondary | ICD-10-CM

## 2020-07-05 DIAGNOSIS — Z7289 Other problems related to lifestyle: Secondary | ICD-10-CM | POA: Diagnosis not present

## 2020-07-05 MED ORDER — TADALAFIL 20 MG PO TABS: 20.0000 mg | ORAL_TABLET | Freq: Every day | ORAL | 5 refills | Status: DC | PRN

## 2020-07-05 NOTE — Progress Notes (Signed)
   Subjective:    Patient ID: Russell Sanford, male    DOB: 1955/10/10, 65 y.o.   MRN: [id number]  HPI He is here for consult concerning erectile dysfunction.  He can get but cannot maintain an erection.  There is also been some issues dealing with his wife who is apparently not is interested in sexual activity as he is.  He seems to have worked this out but now he would like to have this taken care of.  He does not smoke but does drink beer stating he drinks is much as a case over the weekend.     Review of Systems     Objective:   Physical Exam  Alert and in no distress otherwise not examined      Assessment & Plan:  Erectile dysfunction due to arterial insufficiency I discussed proper use of the medication and possible side effects of the medication.  Explained that he might not need it every time he wants to have sex.  Also cautioned him about the use of alcohol and potential interfering with sexual activity.

## 2020-08-26 ENCOUNTER — Telehealth: Payer: Self-pay

## 2020-08-26 MED ORDER — SILDENAFIL CITRATE 100 MG PO TABS: 100.0000 mg | ORAL_TABLET | Freq: Every day | ORAL | 11 refills | Status: DC | PRN

## 2020-08-26 NOTE — Telephone Encounter (Signed)
Pt. Called stating he wanted to let you know the Cialis is not working and he would like to try something else.

## 2020-08-26 NOTE — Telephone Encounter (Signed)
Pt aware.

## 2020-08-26 NOTE — Telephone Encounter (Signed)
Let him know that ice called in a small dose to the [facility] and if it works I will give him more.

## 2020-09-15 ENCOUNTER — Other Ambulatory Visit: Payer: Self-pay | Admitting: Family Medicine

## 2020-09-15 MED ORDER — SILDENAFIL CITRATE 100 MG PO TABS: 100.0000 mg | ORAL_TABLET | Freq: Every day | ORAL | 5 refills | Status: DC | PRN

## 2020-09-15 NOTE — Telephone Encounter (Signed)
Pt called and states that the new medication he was given, Viagra, works better. He would like a rx sent to pharmacy. Pt can be reached at [phone].

## 2020-09-15 NOTE — Telephone Encounter (Signed)
Find out which drugstore he wants it sent to.  [facility] or [facility] would be the best

## 2020-09-15 NOTE — Telephone Encounter (Signed)
Pt would like to have [facility] [address]. [facility]

## 2020-09-15 NOTE — Addendum Note (Signed)
Addended by: Ronnald Nian on: 09/15/2020 05:29 PM   Modules accepted: Orders

## 2020-09-15 NOTE — Telephone Encounter (Signed)
Lvm for pt to ask where pt would [facility]. [facility]

## 2020-09-15 NOTE — Telephone Encounter (Signed)
Please send to [facility] [address]. [facility]

## 2020-09-15 NOTE — Telephone Encounter (Signed)
Pt called back and would like  Back and would like it sent it to the [facility] - [place], [place] - [address] [place]

## 2020-11-08 ENCOUNTER — Encounter: Payer: Self-pay | Admitting: Gastroenterology

## 2021-01-26 DIAGNOSIS — M1711 Unilateral primary osteoarthritis, right knee: Secondary | ICD-10-CM | POA: Diagnosis not present

## 2021-02-14 ENCOUNTER — Encounter: Payer: Self-pay | Admitting: Family Medicine

## 2021-02-14 ENCOUNTER — Other Ambulatory Visit: Payer: Self-pay

## 2021-02-14 ENCOUNTER — Ambulatory Visit (INDEPENDENT_AMBULATORY_CARE_PROVIDER_SITE_OTHER): Payer: Medicare Other | Admitting: Family Medicine

## 2021-02-14 VITALS — BP 128/88 | HR 87 | Temp 98.1°F | Ht 71.0 in | Wt 270.2 lb

## 2021-02-14 DIAGNOSIS — Z8249 Family history of ischemic heart disease and other diseases of the circulatory system: Secondary | ICD-10-CM | POA: Diagnosis not present

## 2021-02-14 DIAGNOSIS — Z96652 Presence of left artificial knee joint: Secondary | ICD-10-CM | POA: Diagnosis not present

## 2021-02-14 DIAGNOSIS — E785 Hyperlipidemia, unspecified: Secondary | ICD-10-CM

## 2021-02-14 DIAGNOSIS — K219 Gastro-esophageal reflux disease without esophagitis: Secondary | ICD-10-CM

## 2021-02-14 DIAGNOSIS — Z789 Other specified health status: Secondary | ICD-10-CM

## 2021-02-14 DIAGNOSIS — M1990 Unspecified osteoarthritis, unspecified site: Secondary | ICD-10-CM | POA: Diagnosis not present

## 2021-02-14 DIAGNOSIS — Z86711 Personal history of pulmonary embolism: Secondary | ICD-10-CM | POA: Diagnosis not present

## 2021-02-14 DIAGNOSIS — R7402 Elevation of levels of lactic acid dehydrogenase (LDH): Secondary | ICD-10-CM

## 2021-02-14 DIAGNOSIS — Z1211 Encounter for screening for malignant neoplasm of colon: Secondary | ICD-10-CM

## 2021-02-14 DIAGNOSIS — I440 Atrioventricular block, first degree: Secondary | ICD-10-CM | POA: Insufficient documentation

## 2021-02-14 DIAGNOSIS — K2270 Barrett's esophagus without dysplasia: Secondary | ICD-10-CM

## 2021-02-14 DIAGNOSIS — N5201 Erectile dysfunction due to arterial insufficiency: Secondary | ICD-10-CM

## 2021-02-14 DIAGNOSIS — Z23 Encounter for immunization: Secondary | ICD-10-CM | POA: Diagnosis not present

## 2021-02-14 DIAGNOSIS — Z86718 Personal history of other venous thrombosis and embolism: Secondary | ICD-10-CM | POA: Diagnosis not present

## 2021-02-14 DIAGNOSIS — R7309 Other abnormal glucose: Secondary | ICD-10-CM | POA: Diagnosis not present

## 2021-02-14 DIAGNOSIS — R7401 Elevation of levels of liver transaminase levels: Secondary | ICD-10-CM | POA: Diagnosis not present

## 2021-02-14 DIAGNOSIS — Z0000 Encounter for general adult medical examination without abnormal findings: Secondary | ICD-10-CM | POA: Diagnosis not present

## 2021-02-14 NOTE — Progress Notes (Signed)
Russell Sanford is a 65 y.o. male who presents for annual wellness visit,CPE and follow-up on chronic medical conditions.  He has a right knee replacement scheduled in the near future and needs clearance.  He had his other knee replaced and subsequently to that had DVT/PE and was on Xarelto.  He does have concerns about that.  He continues on atorvastatin for his lipids and is having no difficulty with that.  He does take Prilosec usually on a weekly basis.  Sildenafil is working well for his underlying ED.  He does have a history of Barrett's esophagus but at this time is having no dysphagia, reflux symptoms, trouble swallowing.  He does have a family history of heart disease.  Presently he is having no chest pain, shortness of breath, PND or DOE.  He does drink alcohol usually 2 beers per night.   Immunizations and Health Maintenance Immunization History  Administered Date(s) Administered   Fluad Quad(high Dose 65+) 02/13/2020, 02/14/2021   Influenza Split 02/24/2013, 02/07/2014   Influenza, Seasonal, Injecte, Preservative Fre 03/13/2012   Influenza,inj,Quad PF,6+ Mos 02/22/2015   PFIZER(Purple Top)SARS-COV-2 Vaccination 07/25/2019, 08/18/2019, 02/13/2020   Pfizer Covid-19 Vaccine Bivalent Booster 35yrs & up 02/14/2021   Pneumococcal Conjugate-13 02/13/2020   Pneumococcal Polysaccharide-23 02/14/2021   Tdap 03/13/2012   Health Maintenance Due  Topic Date Due   Fecal DNA (Cologuard)  Never done   Last colonoscopy: 04/17/10  Last PSA: 05/02/11 level .27 Dentist:Q year Ophtho: over one year Exercise: Not at this time due to knee   Other doctors caring for patient include: Dr. Thomasena Edis and Dr. Rennis Chris ortho, Dr. Eden Emms cardiology pre op eval, Dr. Juanda Chance GI  Advanced Directives: Does Patient Have a Medical Advance Directive?: Yes Type of Advance Directive: Living will Does patient want to make changes to medical advance directive?: No - Patient declined  Depression screen:  See questionnaire  below.     Depression screen [facility] 2/9 02/14/2021 02/13/2020 02/22/2015 07/08/2012  Decreased Interest 0 0 0 3  Down, Depressed, Hopeless 0 0 0 3  PHQ - 2 Score 0 0 0 6    Fall Screen: See Questionaire below.   Fall Risk  02/14/2021 02/13/2020 02/22/2015  Falls in the past year? 0 0 No  Number falls in past yr: 0 - -  Injury with Fall? 0 - -  Risk for fall due to : No Fall Risks No Fall Risks -  Follow up Falls evaluation completed - -    ADL screen:  See questionnaire below.  Functional Status Survey: Is the patient deaf or have difficulty hearing?: No Does the patient have difficulty seeing, even when wearing glasses/contacts?: No Does the patient have difficulty concentrating, remembering, or making decisions?: No Does the patient have difficulty walking or climbing stairs?: No Does the patient have difficulty dressing or bathing?: No Does the patient have difficulty doing errands alone such as visiting a doctor's office or shopping?: No   Review of Systems  Constitutional: -, -unexpected weight change, -anorexia, -fatigue Allergy: -sneezing, -itching, -congestion Dermatology: denies changing moles, rash, lumps ENT: -runny nose, -ear pain, -sore throat,  Cardiology:  -chest pain, -palpitations, -orthopnea, Respiratory: -cough, -shortness of breath, -dyspnea on exertion, -wheezing,  Gastroenterology: -abdominal pain, -nausea, -vomiting, -diarrhea, -constipation, -dysphagia Hematology: -bleeding or bruising problems Musculoskeletal: -arthralgias, -myalgias, -joint swelling, -back pain, - Ophthalmology: -vision changes,  Urology: -dysuria, -difficulty urinating,  -urinary frequency, -urgency, incontinence Neurology: -, -numbness, , -memory loss, -falls, -dizziness    PHYSICAL EXAM:  General  Appearance: Alert, cooperative, no distress, appears stated age Head: Normocephalic, without obvious abnormality, atraumatic Eyes: PERRL, conjunctiva/corneas clear, EOM's intact,   Ears: Normal TM's and external ear canals Nose: Nares normal, mucosa normal, no drainage or sinus   tenderness Throat: Lips, mucosa, and tongue normal; teeth and gums normal Neck: Supple, no lymphadenopathy, thyroid:no enlargement/tenderness/nodules; no carotid bruit or JVD Lungs: Clear to auscultation bilaterally without wheezes, rales or ronchi; respirations unlabored Heart: Regular rate and rhythm, S1 and S2 normal, no murmur, rub or gallop Abdomen: Soft, non-tender, nondistended, normoactive bowel sounds, no masses, no hepatosplenomegaly Skin: Skin color, texture, turgor normal, no rashes or lesions Lymph nodes: Cervical, supraclavicular, and axillary nodes normal Neurologic: CNII-XII intact, normal strength, sensation and gait; reflexes 2+ and symmetric throughout   Psych: Normal mood, affect, hygiene and grooming EKG read by me did show evidence of the first otherwise normal. ASSESSMENT/PLAN: Routine general medical examination at a health care facility  Need for influenza vaccination - Plan: Flu Vaccine QUAD High Dose(Fluad)  Need for COVID-19 vaccine - Plan: Research officer, trade union  Gastroesophageal reflux disease without esophagitis - Plan: CBC with Differential/Platelet, Comprehensive metabolic panel  Barrett's esophagus without dysplasia  History of pulmonary embolus (PE)  Status post total left knee replacement  Hyperlipidemia, unspecified hyperlipidemia type - Plan: Lipid panel  Family history of heart disease in male family member before age 21 - Plan: Lipid panel, EKG 12-Lead  Arthritis  History of DVT (deep vein thrombosis)  Erectile dysfunction due to arterial insufficiency  Alcohol use  NONSPEC ELEVATION OF LEVELS OF TRANSAMINASE/LDH - Plan: Comprehensive metabolic panel  First degree heart block - Plan: EKG 12-Lead  Screening for colon cancer - Plan: Cologuard  Need for vaccination against Streptococcus pneumoniae - Plan: Pneumococcal  polysaccharide vaccine 23-valent greater than or equal to 2yo subcutaneous/IM I discussed his alcohol consumption and recommended that he cut back to 1 or 2 drinks especially with his history of function tests remain slightly abnormal. I will sign off on him having the surgery but encouraged to be placed on Xarelto relatively quickly and I will keep him on it for roughly a month to help prevent from having a DVT/PE.  Continue on his present medications including sildenafil.   Discussed PSA screening (risks/benefits), recommended at least 30 minutes of aerobic activity at least 5 days/week; proper sunscreen use reviewed; healthy diet and alcohol recommendations (less than or equal to 2 drinks/day) reviewed; regular seatbelt use; changing batteries in smoke detectors. Immunization recommendations discussed.  Colonoscopy recommendations reviewed.   Medicare Attestation I have personally reviewed: The patient's medical and social history Their use of alcohol, tobacco or illicit drugs Their current medications and supplements The patient's functional ability including ADLs,fall risks, home safety risks, cognitive, and hearing and visual impairment Diet and physical activities Evidence for depression or mood disorders  The patient's weight, height, and BMI have been recorded in the chart.  I have made referrals, counseling, and provided education to the patient based on review of the above and I have provided the patient with a written personalized care plan for preventive services.     Sharlot Gowda, MD   02/14/2021

## 2021-02-15 LAB — COMPREHENSIVE METABOLIC PANEL
ALT: 28 IU/L (ref 0–44)
AST: 32 IU/L (ref 0–40)
Albumin/Globulin Ratio: 1.7 (ref 1.2–2.2)
Albumin: 4.7 g/dL (ref 3.8–4.8)
Alkaline Phosphatase: 91 IU/L (ref 44–121)
BUN/Creatinine Ratio: 10 (ref 10–24)
BUN: 8 mg/dL (ref 8–27)
Bilirubin Total: 1.1 mg/dL (ref 0.0–1.2)
CO2: 24 mmol/L (ref 20–29)
Calcium: 9.6 mg/dL (ref 8.6–10.2)
Chloride: 102 mmol/L (ref 96–106)
Creatinine, Ser: 0.84 mg/dL (ref 0.76–1.27)
Globulin, Total: 2.8 g/dL (ref 1.5–4.5)
Glucose: 106 mg/dL — ABNORMAL HIGH (ref 70–99)
Potassium: 5.2 mmol/L (ref 3.5–5.2)
Sodium: 139 mmol/L (ref 134–144)
Total Protein: 7.5 g/dL (ref 6.0–8.5)
eGFR: 96 mL/min/{1.73_m2} (ref >59)

## 2021-02-15 LAB — LIPID PANEL
Chol/HDL Ratio: 3.1 ratio (ref 0.0–5.0)
Cholesterol, Total: 161 mg/dL (ref 100–199)
HDL: 52 mg/dL (ref >39)
LDL Chol Calc (NIH): 91 mg/dL (ref 0–99)
Triglycerides: 101 mg/dL (ref 0–149)
VLDL Cholesterol Cal: 18 mg/dL (ref 5–40)

## 2021-02-15 LAB — CBC WITH DIFFERENTIAL/PLATELET
Basophils Absolute: 0 10*3/uL (ref 0.0–0.2)
Basos: 0 %
EOS (ABSOLUTE): 0.1 10*3/uL (ref 0.0–0.4)
Eos: 2 %
Hematocrit: 47.6 % (ref 37.5–51.0)
Hemoglobin: 16.2 g/dL (ref 13.0–17.7)
Immature Grans (Abs): 0 10*3/uL (ref 0.0–0.1)
Immature Granulocytes: 1 %
Lymphocytes Absolute: 1.4 10*3/uL (ref 0.7–3.1)
Lymphs: 27 %
MCH: 32 pg (ref 26.6–33.0)
MCHC: 34 g/dL (ref 31.5–35.7)
MCV: 94 fL (ref 79–97)
Monocytes Absolute: 0.6 10*3/uL (ref 0.1–0.9)
Monocytes: 12 %
Neutrophils Absolute: 2.9 10*3/uL (ref 1.4–7.0)
Neutrophils: 58 %
Platelets: 246 10*3/uL (ref 150–450)
RBC: 5.06 x10E6/uL (ref 4.14–5.80)
RDW: 12.1 % (ref 11.6–15.4)
WBC: 5 10*3/uL (ref 3.4–10.8)

## 2021-02-17 LAB — SPECIMEN STATUS REPORT

## 2021-02-17 LAB — HGB A1C W/O EAG: Hgb A1c MFr Bld: 5.3 % (ref 4.8–5.6)

## 2021-02-20 DIAGNOSIS — Z1211 Encounter for screening for malignant neoplasm of colon: Secondary | ICD-10-CM | POA: Diagnosis not present

## 2021-02-27 LAB — COLOGUARD: COLOGUARD: NEGATIVE

## 2021-03-01 ENCOUNTER — Other Ambulatory Visit: Payer: Self-pay | Admitting: Family Medicine

## 2021-03-09 DIAGNOSIS — Z0189 Encounter for other specified special examinations: Secondary | ICD-10-CM | POA: Diagnosis not present

## 2021-03-22 DIAGNOSIS — G8918 Other acute postprocedural pain: Secondary | ICD-10-CM | POA: Diagnosis not present

## 2021-03-22 DIAGNOSIS — M1711 Unilateral primary osteoarthritis, right knee: Secondary | ICD-10-CM | POA: Diagnosis not present

## 2021-04-06 DIAGNOSIS — Z4789 Encounter for other orthopedic aftercare: Secondary | ICD-10-CM | POA: Diagnosis not present

## 2021-04-13 ENCOUNTER — Ambulatory Visit (HOSPITAL_COMMUNITY)
Admission: RE | Admit: 2021-04-13 | Discharge: 2021-04-13 | Disposition: A | Discharge status: Home / Self Care | Payer: Medicare Other | Source: Ambulatory Visit | Attending: Internal Medicine | Admitting: Internal Medicine

## 2021-04-13 ENCOUNTER — Other Ambulatory Visit: Payer: Self-pay

## 2021-04-13 ENCOUNTER — Other Ambulatory Visit (HOSPITAL_COMMUNITY): Payer: Self-pay | Admitting: Specialist

## 2021-04-13 DIAGNOSIS — M7989 Other specified soft tissue disorders: Secondary | ICD-10-CM | POA: Insufficient documentation

## 2021-05-05 ENCOUNTER — Telehealth: Payer: Self-pay

## 2021-05-05 MED ORDER — TADALAFIL 20 MG PO TABS: 20.0000 mg | ORAL_TABLET | Freq: Every day | ORAL | 5 refills | Status: DC | PRN

## 2021-05-05 NOTE — Telephone Encounter (Signed)
Pt would like refill Tadalafil 20mg  #30 to [facility], he takes this daily and uses [facility] card.

## 2021-06-13 DIAGNOSIS — Z4789 Encounter for other orthopedic aftercare: Secondary | ICD-10-CM | POA: Diagnosis not present

## 2021-09-05 DIAGNOSIS — Z4789 Encounter for other orthopedic aftercare: Secondary | ICD-10-CM | POA: Diagnosis not present

## 2022-01-05 ENCOUNTER — Telehealth: Payer: Self-pay | Admitting: Licensed Clinical Social Worker

## 2022-01-05 NOTE — Patient Outreach (Signed)
  Care Coordination   01/05/2022 Name: Russell Sanford MRN: [id number] DOB: [birth year]   Care Coordination Outreach Attempts:  An unsuccessful telephone outreach was attempted today to offer the patient information about available care coordination services as a benefit of their health plan.   Follow Up Plan:  Additional outreach attempts will be made to offer the patient care coordination information and services.   Encounter Outcome:  No Answer  Care Coordination Interventions Activated:  No   Care Coordination Interventions:  No, not indicated    Christa See, MSW, [email or web address] Phone [phone] 5:09 PM

## 2022-01-21 ENCOUNTER — Other Ambulatory Visit: Payer: Self-pay | Admitting: Family Medicine

## 2022-01-23 NOTE — Telephone Encounter (Signed)
[facility] requesting to fill pt tadalafil Please advise [facility] 

## 2022-02-17 ENCOUNTER — Ambulatory Visit (INDEPENDENT_AMBULATORY_CARE_PROVIDER_SITE_OTHER): Payer: Medicare Other

## 2022-02-17 VITALS — Ht 73.0 in | Wt 277.0 lb

## 2022-02-17 DIAGNOSIS — Z0000 Encounter for general adult medical examination without abnormal findings: Secondary | ICD-10-CM

## 2022-02-17 NOTE — Progress Notes (Signed)
I connected with Russell Sanford today by telephone and verified that I am speaking with the correct person using two identifiers. Location patient: home Location provider: work Persons participating in the virtual visit: Russell Sanford, Glenna Durand LPN.   I discussed the limitations, risks, security and privacy concerns of performing an evaluation and management service by telephone and the availability of in person appointments. I also discussed with the patient that there may be a patient responsible charge related to this service. The patient expressed understanding and verbally consented to this telephonic visit.    Interactive audio and video telecommunications were attempted between this provider and patient, however failed, due to patient having technical difficulties OR patient did not have access to video capability.  We continued and completed visit with audio only.     Vital signs may be patient reported or missing.  Subjective:   Russell Sanford is a 66 y.o. male who presents for Medicare Annual/Subsequent preventive examination.  Review of Systems     Cardiac Risk Factors include: advanced age (>75men, >9 women);dyslipidemia;obesity (BMI >30kg/m2)     Objective:    Today's Vitals   02/17/22 1001  Weight: 277 lb (125.6 kg)  Height: 6\' 1"  (1.854 m)   Body mass index is 36.55 kg/m.     02/17/2022   10:06 AM 02/14/2021   10:28 AM 02/13/2020    9:22 AM 10/16/2016   11:08 AM 08/29/2012    2:33 PM 08/23/2012   12:22 PM 07/02/2012    7:00 AM  Advanced Directives  Does Patient Have a Medical Advance Directive? Yes Yes Yes No Patient has advance directive, copy not in chart Patient has advance directive, copy not in chart Patient does not have advance directive;Patient would not like information  Type of Scientist, forensic Power of [place];Living will Living will Living will   [place];Living will   Does patient want to make changes to medical  advance directive?  No - Patient declined       Copy of [place] in Chart? No - copy requested    Copy requested from family      Current Medications (verified) Outpatient Encounter Medications as of 02/17/2022  Medication Sig   omeprazole (PRILOSEC OTC) 20 MG tablet Take 20 mg by mouth every morning. Once weekly   atorvastatin (LIPITOR) 20 MG tablet TAKE 1 TABLET(20 MG) BY MOUTH DAILY (Patient not taking: Reported on 02/17/2022)   ondansetron (ZOFRAN ODT) 4 MG disintegrating tablet Take 1 tablet (4 mg total) by mouth every 8 (eight) hours as needed for nausea or vomiting. (Patient not taking: Reported on 02/13/2020)   polyethylene glycol (MIRALAX / GLYCOLAX) packet Take 17 g by mouth daily. (Patient not taking: Reported on 02/13/2020)   tadalafil (CIALIS) 20 MG tablet TAKE ONE TABLET BY MOUTH DAILY AS NEEDED FOR ERECTILE DYSFUNCTION (Patient not taking: Reported on 02/17/2022)   No facility-administered encounter medications on file as of 02/17/2022.    Allergies (verified) Sulfonamide derivatives   History: Past Medical History:  Diagnosis Date   Arthritis    Barrett's esophagus    DVT (deep venous thrombosis) ([place])    a. Posterior tibial vein DVT 05/2012 following L TKA.   Dysrhythmia    GERD (gastroesophageal reflux disease)    HH (hiatus hernia)    Hypercholesteremia    Pulmonary embolism (HCC)    a. Large burden bilat PE on CTA 05/2012 following L TKA. F/U CT 07/02/12 showed recanalization of the vessels  involved with PE   Past Surgical History:  Procedure Laterality Date   FRACTURE SURGERY     left lower leg   KNEE ARTHROSCOPY     bilateral   LASIK     SHOULDER ARTHROSCOPY     bilateral   TOE AMPUTATION     left big toe   TOTAL KNEE ARTHROPLASTY  05/17/2012   Procedure: TOTAL KNEE ARTHROPLASTY;  Surgeon: Eugenia Mcalpine, MD;  Location: [facility];  Service: Orthopedics;  Laterality: Left;   TOTAL KNEE ARTHROPLASTY Right    03/2021   TOTAL SHOULDER  ARTHROPLASTY Left 08/29/2012   Procedure: LEFT SHOULDER TOTAL ARTHROPLASTY;  Surgeon: Senaida Lange, MD;  Location: [facility];  Service: Orthopedics;  Laterality: Left;   TOTAL SHOULDER REPLACEMENT Left 08/29/2012   Dr Rennis Chris   Family History  Problem Relation Age of Onset   Heart attack Father 76       Father died at age 63 of massive MI   Arrhythmia Mother        Scarlet fever -> "fibrillation"   Social History   Socioeconomic History   Marital status: Married    Spouse name: Not on file   Number of children: Not on file   Years of education: Not on file   Highest education level: Not on file  Occupational History    Employer: [employer]    Comment: President of [employer]  Tobacco Use   Smoking status: Never   Smokeless tobacco: Never  Vaping Use   Vaping Use: Never used  Substance and Sexual Activity   Alcohol use: Not Currently    Comment: beers occasionally   Drug use: No   Sexual activity: Yes  Other Topics Concern   Not on file  Social History Narrative   Not on file   Social Determinants of Health   Financial Resource Strain: Low Risk  (02/17/2022)   Overall Financial Resource Strain (CARDIA)    Difficulty of Paying Living Expenses: Not hard at all  Food Insecurity: No Food Insecurity (02/17/2022)   Hunger Vital Sign    Worried About Running Out of Food in the Last Year: Never true    Ran Out of Food in the Last Year: Never true  Transportation Needs: No Transportation Needs (02/17/2022)   PRAPARE - Administrator, Civil Service (Medical): No    Lack of Transportation (Non-Medical): No  Physical Activity: Inactive (02/17/2022)   Exercise Vital Sign    Days of Exercise per Week: 0 days    Minutes of Exercise per Session: 0 min  Stress: No Stress Concern Present (02/17/2022)   [employer] of Occupational Health - Occupational Stress Questionnaire    Feeling of Stress : Not at all  Social Connections: Not on file     Tobacco Counseling Counseling given: Not Answered   Clinical Intake:  Pre-visit preparation completed: Yes  Pain : No/denies pain     Nutritional Status: BMI > 30  Obese Nutritional Risks: None Diabetes: No  How often do you need to have someone help you when you read instructions, pamphlets, or other written materials from your doctor or pharmacy?: 1 - Never What is the last grade level you completed in school?: 12th grade  Diabetic? no  Interpreter Needed?: No  Information entered by :: NAllen LPN   Activities of Daily Living    02/17/2022   10:07 AM  In your present state of health, do you have any difficulty performing the  following activities:  Hearing? 0  Vision? 0  Difficulty concentrating or making decisions? 0  Walking or climbing stairs? 0  Dressing or bathing? 0  Doing errands, shopping? 0  Preparing Food and eating ? N  Using the Toilet? N  In the past six months, have you accidently leaked urine? N  Do you have problems with loss of bowel control? N  Managing your Medications? N  Managing your Finances? N  Housekeeping or managing your Housekeeping? N    Patient Care Team: Ronnald Nian, MD as PCP - General  Indicate any recent Medical Services you may have received from other than [facility] providers in the past year (date may be approximate).     Assessment:   This is a routine wellness examination for [place].  Hearing/Vision screen Vision Screening - Comments:: No regular eye exams  Dietary issues and exercise activities discussed: Current Exercise Habits: The patient has a physically strenuous job, but has no regular exercise apart from work.   Goals Addressed             This Visit's Progress    Patient Stated       02/17/2022, no goals       Depression Screen    02/17/2022   10:07 AM 02/14/2021   10:30 AM 02/13/2020    9:20 AM 02/22/2015    1:39 PM 07/08/2012    8:58 AM  PHQ 2/9 Scores  PHQ - 2 Score 0 0 0 0 6  PHQ-  9 Score 0        Fall Risk    02/17/2022   10:06 AM 02/14/2021   10:29 AM 02/13/2020    9:20 AM 02/22/2015    1:39 PM  Fall Risk   Falls in the past year? 0 0 0 No  Number falls in past yr: 0 0    Injury with Fall? 0 0    Risk for fall due to : No Fall Risks No Fall Risks No Fall Risks   Follow up Falls prevention discussed;Education provided;Falls evaluation completed Falls evaluation completed      FALL RISK PREVENTION PERTAINING TO THE HOME:  Any stairs in or around the home? Yes  If so, are there any without handrails? Yes  Home free of loose throw rugs in walkways, pet beds, electrical cords, etc? Yes  Adequate lighting in your home to reduce risk of falls? Yes   ASSISTIVE DEVICES UTILIZED TO PREVENT FALLS:  Life alert? No  Use of a cane, walker or w/c? No  Grab bars in the bathroom? No  Shower chair or bench in shower? Yes  Elevated toilet seat or a handicapped toilet? Yes   TIMED UP AND GO:  Was the test performed? No .      Cognitive Function:        02/17/2022   10:08 AM  6CIT Screen  What Year? 0 points  What month? 0 points  What time? 0 points  Count back from 20 0 points  Months in reverse 0 points  Repeat phrase 0 points  Total Score 0 points    Immunizations Immunization History  Administered Date(s) Administered   Fluad Quad(high Dose 65+) 02/13/2020, 02/14/2021   Influenza Split 02/24/2013, 02/07/2014   Influenza, Seasonal, Injecte, Preservative Fre 03/13/2012   Influenza,inj,Quad PF,6+ Mos 02/22/2015   PFIZER(Purple Top)SARS-COV-2 Vaccination 07/25/2019, 08/18/2019, 02/13/2020   Pfizer Covid-19 Vaccine Bivalent Booster 71yrs & up 02/14/2021   Pneumococcal Conjugate-13 02/13/2020   Pneumococcal Polysaccharide-23 02/14/2021  Tdap 03/13/2012    TDAP status: Up to date  Flu Vaccine status: Due, Education has been provided regarding the importance of this vaccine. Advised may receive this vaccine at local pharmacy or Health Dept. Aware  to provide a copy of the vaccination record if obtained from local pharmacy or Health Dept. Verbalized acceptance and understanding.  Pneumococcal vaccine status: Up to date  Covid-19 vaccine status: Completed vaccines  Qualifies for Shingles Vaccine? Yes   Zostavax completed No   Shingrix Completed?: No.    Education has been provided regarding the importance of this vaccine. Patient has been advised to call insurance company to determine out of pocket expense if they have not yet received this vaccine. Advised may also receive vaccine at local pharmacy or Health Dept. Verbalized acceptance and understanding.  Screening Tests Health Maintenance  Topic Date Due   Zoster Vaccines- Shingrix (1 of 2) Never done   COVID-19 Vaccine (5 - Pfizer series) 06/14/2021   INFLUENZA VACCINE  11/15/2021   Medicare Annual Wellness (AWV)  02/14/2022   TETANUS/TDAP  03/13/2022   Fecal DNA (Cologuard)  02/21/2024   Pneumonia Vaccine 32+ Years old  Completed   Hepatitis C Screening  Completed   HPV VACCINES  Aged Out   COLONOSCOPY (Pts 45-37yrs Insurance coverage will need to be confirmed)  Discontinued    Health Maintenance  Health Maintenance Due  Topic Date Due   Zoster Vaccines- Shingrix (1 of 2) Never done   COVID-19 Vaccine (5 - Pfizer series) 06/14/2021   INFLUENZA VACCINE  11/15/2021   Medicare Annual Wellness (AWV)  02/14/2022    Colorectal cancer screening: Type of screening: Cologuard. Completed 02/20/2021. Repeat every 3 years  Lung Cancer Screening: (Low Dose CT Chest recommended if Age 14-80 years, 30 pack-year currently smoking OR have quit w/in 15years.) does not qualify.   Lung Cancer Screening Referral: no  Additional Screening:  Hepatitis C Screening: does qualify; Completed 02/22/2015  Vision Screening: Recommended annual ophthalmology exams for early detection of glaucoma and other disorders of the eye. Is the patient up to date with their annual eye exam?  No  Who is  the provider or what is the name of the office in which the patient attends annual eye exams? none If pt is not established with a provider, would they like to be referred to a provider to establish care? No .   Dental Screening: Recommended annual dental exams for proper oral hygiene  Community Resource Referral / Chronic Care Management: CRR required this visit?  No   CCM required this visit?  No      Plan:     I have personally reviewed and noted the following in the patient's chart:   Medical and social history Use of alcohol, tobacco or illicit drugs  Current medications and supplements including opioid prescriptions. Patient is not currently taking opioid prescriptions. Functional ability and status Nutritional status Physical activity Advanced directives List of other physicians Hospitalizations, surgeries, and ER visits in previous 12 months Vitals Screenings to include cognitive, depression, and falls Referrals and appointments  In addition, I have reviewed and discussed with patient certain preventive protocols, quality metrics, and best practice recommendations. A written personalized care plan for preventive services as well as general preventive health recommendations were provided to patient.     Barb Merino, LPN   09/16/3760   Nurse Notes: none  Due to this being a virtual visit, the after visit summary with patients personalized plan was offered to  patient via mail or my-chart.  to pick up at office at next visit

## 2022-02-17 NOTE — Patient Instructions (Signed)
Russell Sanford , Thank you for taking time to come for your Medicare Wellness Visit. I appreciate your ongoing commitment to your health goals. Please review the following plan we discussed and let me know if I can assist you in the future.   Screening recommendations/referrals: Colonoscopy: cologuard 02/20/2021, due 02/21/2024 Recommended yearly ophthalmology/optometry visit for glaucoma screening and checkup Recommended yearly dental visit for hygiene and checkup  Vaccinations: Influenza vaccine: due Pneumococcal vaccine: completed 02/14/2021 Tdap vaccine: completed 03/13/2012, due 03/13/2022 Shingles vaccine: discussed   Covid-19:  02/14/2021, 02/13/2020, 08/18/2019, 07/25/2019  Advanced directives: Please bring a copy of your POA (Power of Attorney) and/or Living Will to your next appointment.   Conditions/risks identified: none  Next appointment: Follow up in one year for your annual wellness visit.   Preventive Care 27 Years and Older, Male Preventive care refers to lifestyle choices and visits with your health care provider that can promote health and wellness. What does preventive care include? A yearly physical exam. This is also called an annual well check. Dental exams once or twice a year. Routine eye exams. Ask your health care provider how often you should have your eyes checked. Personal lifestyle choices, including: Daily care of your teeth and gums. Regular physical activity. Eating a healthy diet. Avoiding tobacco and drug use. Limiting alcohol use. Practicing safe sex. Taking low doses of aspirin every day. Taking vitamin and mineral supplements as recommended by your health care provider. What happens during an annual well check? The services and screenings done by your health care provider during your annual well check will depend on your age, overall health, lifestyle risk factors, and family history of disease. Counseling  Your health care provider may ask you  questions about your: Alcohol use. Tobacco use. Drug use. Emotional well-being. Home and relationship well-being. Sexual activity. Eating habits. History of falls. Memory and ability to understand (cognition). Work and work Statistician. Screening  You may have the following tests or measurements: Height, weight, and BMI. Blood pressure. Lipid and cholesterol levels. These may be checked every 5 years, or more frequently if you are over 75 years old. Skin check. Lung cancer screening. You may have this screening every year starting at age 73 if you have a 30-pack-year history of smoking and currently smoke or have quit within the past 15 years. Fecal occult blood test (FOBT) of the stool. You may have this test every year starting at age 1. Flexible sigmoidoscopy or colonoscopy. You may have a sigmoidoscopy every 5 years or a colonoscopy every 10 years starting at age 20. Prostate cancer screening. Recommendations will vary depending on your family history and other risks. Hepatitis C blood test. Hepatitis B blood test. Sexually transmitted disease (STD) testing. Diabetes screening. This is done by checking your blood sugar (glucose) after you have not eaten for a while (fasting). You may have this done every 1-3 years. Abdominal aortic aneurysm (AAA) screening. You may need this if you are a current or former smoker. Osteoporosis. You may be screened starting at age 31 if you are at high risk. Talk with your health care provider about your test results, treatment options, and if necessary, the need for more tests. Vaccines  Your health care provider may recommend certain vaccines, such as: Influenza vaccine. This is recommended every year. Tetanus, diphtheria, and acellular pertussis (Tdap, Td) vaccine. You may need a Td booster every 10 years. Zoster vaccine. You may need this after age 58. Pneumococcal 13-valent conjugate (PCV13) vaccine. One dose  is recommended after age  65. Pneumococcal polysaccharide (PPSV23) vaccine. One dose is recommended after age 7. Talk to your health care provider about which screenings and vaccines you need and how often you need them. This information is not intended to replace advice given to you by your health care provider. Make sure you discuss any questions you have with your health care provider. Document Released: 04/30/2015 Document Revised: 12/22/2015 Document Reviewed: 02/02/2015 Elsevier Interactive Patient Education  2017 [place] Prevention in the Home Falls can cause injuries. They can happen to people of all ages. There are many things you can do to make your home safe and to help prevent falls. What can I do on the outside of my home? Regularly fix the edges of walkways and driveways and fix any cracks. Remove anything that might make you trip as you walk through a door, such as a raised step or threshold. Trim any bushes or trees on the path to your home. Use bright outdoor lighting. Clear any walking paths of anything that might make someone trip, such as rocks or tools. Regularly check to see if handrails are loose or broken. Make sure that both sides of any steps have handrails. Any raised decks and porches should have guardrails on the edges. Have any leaves, snow, or ice cleared regularly. Use sand or salt on walking paths during winter. Clean up any spills in your garage right away. This includes oil or grease spills. What can I do in the bathroom? Use night lights. Install grab bars by the toilet and in the tub and shower. Do not use towel bars as grab bars. Use non-skid mats or decals in the tub or shower. If you need to sit down in the shower, use a plastic, non-slip stool. Keep the floor dry. Clean up any water that spills on the floor as soon as it happens. Remove soap buildup in the tub or shower regularly. Attach bath mats securely with double-sided non-slip rug tape. Do not have throw  rugs and other things on the floor that can make you trip. What can I do in the bedroom? Use night lights. Make sure that you have a light by your bed that is easy to reach. Do not use any sheets or blankets that are too big for your bed. They should not hang down onto the floor. Have a firm chair that has side arms. You can use this for support while you get dressed. Do not have throw rugs and other things on the floor that can make you trip. What can I do in the kitchen? Clean up any spills right away. Avoid walking on wet floors. Keep items that you use a lot in easy-to-reach places. If you need to reach something above you, use a strong step stool that has a grab bar. Keep electrical cords out of the way. Do not use floor polish or wax that makes floors slippery. If you must use wax, use non-skid floor wax. Do not have throw rugs and other things on the floor that can make you trip. What can I do with my stairs? Do not leave any items on the stairs. Make sure that there are handrails on both sides of the stairs and use them. Fix handrails that are broken or loose. Make sure that handrails are as long as the stairways. Check any carpeting to make sure that it is firmly attached to the stairs. Fix any carpet that is loose or worn. Avoid  having throw rugs at the top or bottom of the stairs. If you do have throw rugs, attach them to the floor with carpet tape. Make sure that you have a light switch at the top of the stairs and the bottom of the stairs. If you do not have them, ask someone to add them for you. What else can I do to help prevent falls? Wear shoes that: Do not have high heels. Have rubber bottoms. Are comfortable and fit you well. Are closed at the toe. Do not wear sandals. If you use a stepladder: Make sure that it is fully opened. Do not climb a closed stepladder. Make sure that both sides of the stepladder are locked into place. Ask someone to hold it for you, if  possible. Clearly mark and make sure that you can see: Any grab bars or handrails. First and last steps. Where the edge of each step is. Use tools that help you move around (mobility aids) if they are needed. These include: Canes. Walkers. Scooters. Crutches. Turn on the lights when you go into a dark area. Replace any light bulbs as soon as they burn out. Set up your furniture so you have a clear path. Avoid moving your furniture around. If any of your floors are uneven, fix them. If there are any pets around you, be aware of where they are. Review your medicines with your doctor. Some medicines can make you feel dizzy. This can increase your chance of falling. Ask your doctor what other things that you can do to help prevent falls. This information is not intended to replace advice given to you by your health care provider. Make sure you discuss any questions you have with your health care provider. Document Released: 01/28/2009 Document Revised: 09/09/2015 Document Reviewed: 05/08/2014 Elsevier Interactive Patient Education  2017 [employer].

## 2022-02-22 ENCOUNTER — Encounter: Payer: Self-pay | Admitting: Family Medicine

## 2022-02-22 ENCOUNTER — Ambulatory Visit (INDEPENDENT_AMBULATORY_CARE_PROVIDER_SITE_OTHER): Payer: Medicare Other | Admitting: Family Medicine

## 2022-02-22 VITALS — BP 130/88 | HR 84 | Ht 72.0 in | Wt 280.2 lb

## 2022-02-22 DIAGNOSIS — N5201 Erectile dysfunction due to arterial insufficiency: Secondary | ICD-10-CM

## 2022-02-22 DIAGNOSIS — Z131 Encounter for screening for diabetes mellitus: Secondary | ICD-10-CM

## 2022-02-22 DIAGNOSIS — K2270 Barrett's esophagus without dysplasia: Secondary | ICD-10-CM | POA: Diagnosis not present

## 2022-02-22 DIAGNOSIS — I440 Atrioventricular block, first degree: Secondary | ICD-10-CM

## 2022-02-22 DIAGNOSIS — R7309 Other abnormal glucose: Secondary | ICD-10-CM

## 2022-02-22 DIAGNOSIS — Z0000 Encounter for general adult medical examination without abnormal findings: Secondary | ICD-10-CM | POA: Diagnosis not present

## 2022-02-22 DIAGNOSIS — Z8249 Family history of ischemic heart disease and other diseases of the circulatory system: Secondary | ICD-10-CM

## 2022-02-22 DIAGNOSIS — Z630 Problems in relationship with spouse or partner: Secondary | ICD-10-CM

## 2022-02-22 DIAGNOSIS — Z789 Other specified health status: Secondary | ICD-10-CM

## 2022-02-22 DIAGNOSIS — M1990 Unspecified osteoarthritis, unspecified site: Secondary | ICD-10-CM

## 2022-02-22 DIAGNOSIS — Z96651 Presence of right artificial knee joint: Secondary | ICD-10-CM | POA: Diagnosis not present

## 2022-02-22 DIAGNOSIS — Z23 Encounter for immunization: Secondary | ICD-10-CM

## 2022-02-22 DIAGNOSIS — Z86711 Personal history of pulmonary embolism: Secondary | ICD-10-CM | POA: Diagnosis not present

## 2022-02-22 DIAGNOSIS — E785 Hyperlipidemia, unspecified: Secondary | ICD-10-CM

## 2022-02-22 DIAGNOSIS — Z86718 Personal history of other venous thrombosis and embolism: Secondary | ICD-10-CM | POA: Diagnosis not present

## 2022-02-22 LAB — CBC WITH DIFFERENTIAL/PLATELET
Basophils Absolute: 0 10*3/uL (ref 0.0–0.2)
Basos: 0 %
EOS (ABSOLUTE): 0.1 10*3/uL (ref 0.0–0.4)
Eos: 3 %
Hematocrit: 47.7 % (ref 37.5–51.0)
Hemoglobin: 16.3 g/dL (ref 13.0–17.7)
Immature Grans (Abs): 0 10*3/uL (ref 0.0–0.1)
Immature Granulocytes: 0 %
Lymphocytes Absolute: 1.1 10*3/uL (ref 0.7–3.1)
Lymphs: 23 %
MCH: 33.5 pg — ABNORMAL HIGH (ref 26.6–33.0)
MCHC: 34.2 g/dL (ref 31.5–35.7)
MCV: 98 fL — ABNORMAL HIGH (ref 79–97)
Monocytes Absolute: 0.6 10*3/uL (ref 0.1–0.9)
Monocytes: 12 %
Neutrophils Absolute: 3 10*3/uL (ref 1.4–7.0)
Neutrophils: 62 %
Platelets: 205 10*3/uL (ref 150–450)
RBC: 4.87 x10E6/uL (ref 4.14–5.80)
RDW: 11.8 % (ref 11.6–15.4)
WBC: 4.8 10*3/uL (ref 3.4–10.8)

## 2022-02-22 LAB — LIPID PANEL
Chol/HDL Ratio: 3.3 ratio (ref 0.0–5.0)
Cholesterol, Total: 213 mg/dL — ABNORMAL HIGH (ref 100–199)
HDL: 65 mg/dL (ref >39)
LDL Chol Calc (NIH): 128 mg/dL — ABNORMAL HIGH (ref 0–99)
Triglycerides: 116 mg/dL (ref 0–149)
VLDL Cholesterol Cal: 20 mg/dL (ref 5–40)

## 2022-02-22 LAB — COMPREHENSIVE METABOLIC PANEL
ALT: 20 IU/L (ref 0–44)
AST: 26 IU/L (ref 0–40)
Albumin/Globulin Ratio: 1.9 (ref 1.2–2.2)
Albumin: 4.9 g/dL (ref 3.9–4.9)
Alkaline Phosphatase: 80 IU/L (ref 44–121)
BUN/Creatinine Ratio: 13 (ref 10–24)
BUN: 11 mg/dL (ref 8–27)
Bilirubin Total: 1.1 mg/dL (ref 0.0–1.2)
CO2: 22 mmol/L (ref 20–29)
Calcium: 9.8 mg/dL (ref 8.6–10.2)
Chloride: 102 mmol/L (ref 96–106)
Creatinine, Ser: 0.85 mg/dL (ref 0.76–1.27)
Globulin, Total: 2.6 g/dL (ref 1.5–4.5)
Glucose: 106 mg/dL — ABNORMAL HIGH (ref 70–99)
Potassium: 5.1 mmol/L (ref 3.5–5.2)
Sodium: 140 mmol/L (ref 134–144)
Total Protein: 7.5 g/dL (ref 6.0–8.5)
eGFR: 95 mL/min/{1.73_m2} (ref >59)

## 2022-02-22 LAB — POCT GLYCOSYLATED HEMOGLOBIN (HGB A1C): Hemoglobin A1C: 5.3 % (ref 4.0–5.6)

## 2022-02-22 MED ORDER — TADALAFIL 20 MG PO TABS: ORAL_TABLET | ORAL | 5 refills | Status: DC

## 2022-02-22 MED ORDER — ATORVASTATIN CALCIUM 20 MG PO TABS: ORAL_TABLET | ORAL | 3 refills | Status: DC

## 2022-02-22 NOTE — Progress Notes (Signed)
Complete physical exam  Patient: Russell Sanford   DOB: [birth year]   66 y.o. Male  MRN: [id number]  Subjective:    Chief Complaint  Patient presents with   Annual Exam    Fasting CPE, has AWV 02/17/22. No new concerns.     Russell Sanford is a 66 y.o. male who presents today for a complete physical exam. He reports consuming a general diet.  Walks 2-3 times a week, 2-3 miles   He generally feels well. He reports sleeping poorly.  He does state that he has had marital distress for a long period of time.  He and his wife are not getting along and at 1 point at least there was discussion of getting a divorce.  He is not sexually active with her but outside the marriage which apparently she is not upset about as long as she does not hear about it.  He would like a refill on his Cialis.  He continues on atorvastatin and having no difficulty with that.  He does have a Barrett's esophagus and does use Prilosec on an as-needed basis.  Continues to drink 3-4 beers per day and is not interested in changing that.  Does have a history of PE as well as DVT probably secondary to his knee replacement.  He does complain of some arthritic type pains.  He continues on Lipitor and having no difficulty with that.  Review of his blood work did show an elevated blood sugar last year.  Most recent fall risk assessment:    02/22/2022    9:15 AM  Fall Risk   Falls in the past year? 0  Number falls in past yr: 0  Injury with Fall? 0  Risk for fall due to : No Fall Risks  Follow up Falls evaluation completed     Most recent depression screenings:    02/22/2022    9:15 AM 02/17/2022   10:07 AM  PHQ 2/9 Scores  PHQ - 2 Score 0 0  PHQ- 9 Score  0    Vision:Not within last year     Patient Care Team: Ronnald Nian, MD as PCP - General   Outpatient Medications Prior to Visit  Medication Sig Note   omeprazole (PRILOSEC OTC) 20 MG tablet Take 20 mg by mouth every morning. Once weekly 02/22/2022: Once a  week   [DISCONTINUED] atorvastatin (LIPITOR) 20 MG tablet TAKE 1 TABLET(20 MG) BY MOUTH DAILY (Patient not taking: Reported on 02/17/2022)    [DISCONTINUED] ondansetron (ZOFRAN ODT) 4 MG disintegrating tablet Take 1 tablet (4 mg total) by mouth every 8 (eight) hours as needed for nausea or vomiting. (Patient not taking: Reported on 02/13/2020)    [DISCONTINUED] polyethylene glycol (MIRALAX / GLYCOLAX) packet Take 17 g by mouth daily. (Patient not taking: Reported on 02/13/2020)    [DISCONTINUED] tadalafil (CIALIS) 20 MG tablet TAKE ONE TABLET BY MOUTH DAILY AS NEEDED FOR ERECTILE DYSFUNCTION (Patient not taking: Reported on 02/17/2022)    No facility-administered medications prior to visit.    Review of Systems  All other systems reviewed and are negative.         Objective:     BP 130/88   Pulse 84   Ht 6' (1.829 m)   Wt 280 lb 3.2 oz (127.1 kg)   BMI 38.00 kg/m    Physical Exam  Alert and in no distress. Tympanic membranes and canals are normal. Pharyngeal area is normal. Neck is supple without adenopathy or thyromegaly.  Cardiac exam shows a regular sinus rhythm without murmurs or gallops. Lungs are clear to auscultation.      Assessment & Plan:    Family history of heart disease in male family member before age 40 - Plan: CBC with Differential/Platelet, Comprehensive metabolic panel, Lipid panel  Erectile dysfunction due to arterial insufficiency - Plan: tadalafil (CIALIS) 20 MG tablet  First degree heart block  Arthritis  Alcohol use - Plan: CBC with Differential/Platelet, Comprehensive metabolic panel  History of DVT (deep vein thrombosis)  History of pulmonary embolus (PE) - Plan: CBC with Differential/Platelet, Comprehensive metabolic panel  Hx of total knee arthroplasty, right  Hyperlipidemia, unspecified hyperlipidemia type - Plan: Lipid panel, atorvastatin (LIPITOR) 20 MG tablet  Screening for diabetes mellitus - Plan: POCT glycosylated hemoglobin (Hb  A1C)  Routine general medical examination at a health care facility  Barrett's esophagus without dysplasia  Need for COVID-19 vaccine - Plan: Pfizer Fall 2023 Covid-19 Vaccine 67yrs and older  Need for influenza vaccination - Plan: Flu Vaccine QUAD High Dose(Fluad), CANCELED: Flu vaccine HIGH DOSE PF (Fluzone High dose)  Marital stress   Immunization History  Administered Date(s) Administered   Fluad Quad(high Dose 65+) 02/13/2020, 02/14/2021   Influenza Split 02/24/2013, 02/07/2014   Influenza, Seasonal, Injecte, Preservative Fre 03/13/2012   Influenza,inj,Quad PF,6+ Mos 02/22/2015   PFIZER(Purple Top)SARS-COV-2 Vaccination 07/25/2019, 08/18/2019, 02/13/2020   Pfizer Covid-19 Vaccine Bivalent Booster 7yrs & up 02/14/2021   Pneumococcal Conjugate-13 02/13/2020   Pneumococcal Polysaccharide-23 02/14/2021   Tdap 03/13/2012    Health Maintenance  Topic Date Due   Zoster Vaccines- Shingrix (1 of 2) Never done   COVID-19 Vaccine (5 - Pfizer series) 06/14/2021   INFLUENZA VACCINE  11/15/2021   TETANUS/TDAP  03/13/2022   Medicare Annual Wellness (AWV)  02/18/2023   Fecal DNA (Cologuard)  02/21/2024   Pneumonia Vaccine 18+ Years old  Completed   Hepatitis C Screening  Completed   HPV VACCINES  Aged Out   COLONOSCOPY (Pts 45-75yrs Insurance coverage will need to be confirmed)  Discontinued    Discussed health benefits of physical activity, and encouraged him to engage in regular exercise appropriate for his age and condition.  He will continue on his present medication regimen.  Also recommend he get Tdap and RSV at the drugstore.  Discussed cutting back on alcohol but at this point he is not interested.  I then discussed getting some counseling and possibly even talking to a lawyer concerning the marital distress that he is under.  Explained the fact that he deserves the right to be happy.  Problem List Items Addressed This Visit     Alcohol use   Relevant Orders   CBC with  Differential/Platelet   Comprehensive metabolic panel   Arthritis   Barrett's esophagus   Erectile dysfunction due to arterial insufficiency   Relevant Medications   atorvastatin (LIPITOR) 20 MG tablet   tadalafil (CIALIS) 20 MG tablet   Family history of heart disease in male family member before age 53 - Primary   Relevant Orders   CBC with Differential/Platelet   Comprehensive metabolic panel   Lipid panel   First degree heart block   Relevant Medications   atorvastatin (LIPITOR) 20 MG tablet   tadalafil (CIALIS) 20 MG tablet   History of DVT (deep vein thrombosis)   History of pulmonary embolus (PE)   Relevant Orders   CBC with Differential/Platelet   Comprehensive metabolic panel   Hx of total knee arthroplasty, right   Hyperlipidemia  Relevant Medications   atorvastatin (LIPITOR) 20 MG tablet   tadalafil (CIALIS) 20 MG tablet   Other Relevant Orders   Lipid panel   Other Visit Diagnoses     Screening for diabetes mellitus       Relevant Orders   POCT glycosylated hemoglobin (Hb A1C)   Routine general medical examination at a health care facility       Need for COVID-19 vaccine       Relevant Orders   Pfizer Fall 2023 Covid-19 Vaccine 22yrs and older   Need for influenza vaccination       Relevant Orders   Flu vaccine HIGH DOSE PF (Fluzone High dose)   Marital stress          No follow-ups on file.     Sharlot Gowda, MD

## 2022-02-28 ENCOUNTER — Ambulatory Visit: Payer: Medicare Other | Admitting: Family Medicine

## 2022-03-20 DIAGNOSIS — Z4789 Encounter for other orthopedic aftercare: Secondary | ICD-10-CM | POA: Diagnosis not present

## 2022-07-20 ENCOUNTER — Telehealth: Payer: Self-pay | Admitting: Family Medicine

## 2022-07-20 MED ORDER — SILDENAFIL CITRATE 100 MG PO TABS: 50.0000 mg | ORAL_TABLET | Freq: Every day | ORAL | 5 refills | Status: DC | PRN

## 2022-07-20 NOTE — Telephone Encounter (Signed)
He called stating that Cialis was not helping with his erectile dysfunction.  I will try him on Viagra and if this is not beneficial, referral to urology will be made.

## 2022-07-20 NOTE — Telephone Encounter (Signed)
Tau called and wanted me to let you know the tadalafil isn't working for him.

## 2022-08-02 ENCOUNTER — Telehealth: Payer: Self-pay | Admitting: Family Medicine

## 2022-08-02 DIAGNOSIS — N5201 Erectile dysfunction due to arterial insufficiency: Secondary | ICD-10-CM

## 2022-08-02 NOTE — Telephone Encounter (Signed)
Russell Sanford called in and states the medication you put him on for erectile dysfunction is not working and he would like the referral to urology.

## 2022-08-02 NOTE — Telephone Encounter (Signed)
Tried calling patient. Left message to call back. 

## 2022-08-03 NOTE — Telephone Encounter (Signed)
Patient advised. Urology has already contacted him with the appointment. Looks like appointment is scheduled for 08/08/2022.   [facility] at [facility]  Address: [address] [place], [place] [address]  Phone: [phone]

## 2022-08-08 ENCOUNTER — Ambulatory Visit: Payer: Medicare Other | Admitting: Urology

## 2022-08-08 ENCOUNTER — Encounter: Payer: Self-pay | Admitting: Urology

## 2022-08-08 VITALS — BP 166/93 | HR 80 | Ht 73.0 in | Wt 280.0 lb

## 2022-08-08 DIAGNOSIS — N528 Other male erectile dysfunction: Secondary | ICD-10-CM

## 2022-08-08 MED ORDER — SILDENAFIL CITRATE 100 MG PO TABS: 100.0000 mg | ORAL_TABLET | Freq: Every day | ORAL | 5 refills | Status: DC | PRN

## 2022-08-08 NOTE — Progress Notes (Signed)
Assessment: 1. Other male erectile dysfunction      Plan: Today I discussed with the patient that he clearly has situational erectile dysfunction with likely a significant psychologic component due to his current situation.  I did offer him Viagra to use to help get through this hopefully transitional period. Rx: Sildenafil 100 mg #30 refill #5. Nature of medication including proper utilization especially emphasizing the need to take it on an empty stomach 1 to 2 hours prior to sexual activity. Follow-up as needed  Chief Complaint: ED  History of Present Illness:  Russell Sanford is a 67 y.o. male with a past medical history of GERD, hypercholesterolemia, and DVT/PE following TKA a 2014 who is seen in consultation from Ronnald Nian, MD for evaluation of ED. Patient reports that he does not have an issue getting an erection but does have trouble maintaining it in different situations.  He admits that when watching sexually explicit movies he has no problems maintaining an erection for an extended period of time.  He is going through a divorce and has a new girlfriend and does have issues maintaining erection in that situation.  It is clearly situational and likely has a significant psychologic component.  He has been using sildenafil but has not been taking it appropriately.     Past Medical History:  Past Medical History:  Diagnosis Date   Arthritis    Barrett's esophagus    DVT (deep venous thrombosis)    a. Posterior tibial vein DVT 05/2012 following L TKA.   Dysrhythmia    GERD (gastroesophageal reflux disease)    HH (hiatus hernia)    Hypercholesteremia    Pulmonary embolism    a. Large burden bilat PE on CTA 05/2012 following L TKA. F/U CT 07/02/12 showed recanalization of the vessels involved with PE    Past Surgical History:  Past Surgical History:  Procedure Laterality Date   FRACTURE SURGERY     left lower leg   KNEE ARTHROSCOPY     bilateral   LASIK      SHOULDER ARTHROSCOPY     bilateral   TOE AMPUTATION     left big toe   TOTAL KNEE ARTHROPLASTY  05/17/2012   Procedure: TOTAL KNEE ARTHROPLASTY;  Surgeon: Eugenia Mcalpine, MD;  Location: [facility];  Service: Orthopedics;  Laterality: Left;   TOTAL KNEE ARTHROPLASTY Right    03/2021   TOTAL SHOULDER ARTHROPLASTY Left 08/29/2012   Procedure: LEFT SHOULDER TOTAL ARTHROPLASTY;  Surgeon: Senaida Lange, MD;  Location: [facility];  Service: Orthopedics;  Laterality: Left;   TOTAL SHOULDER REPLACEMENT Left 08/29/2012   Dr Rennis Chris    Allergies:  Allergies  Allergen Reactions   Sulfonamide Derivatives Rash    Family History:  Family History  Problem Relation Age of Onset   Heart attack Father 62       Father died at age 16 of massive MI   Arrhythmia Mother        Scarlet fever -> "fibrillation"    Social History:  Social History   Tobacco Use   Smoking status: Never   Smokeless tobacco: Never  Vaping Use   Vaping Use: Never used  Substance Use Topics   Alcohol use: Not Currently    Comment: beers occasionally   Drug use: No    Review of symptoms:  Constitutional:  Negative for unexplained weight loss, night sweats, fever, chills ENT:  Negative for nose bleeds, sinus pain, painful swallowing CV:  Negative for  chest pain, shortness of breath, exercise intolerance, palpitations, loss of consciousness Resp:  Negative for cough, wheezing, shortness of breath GI:  Negative for nausea, vomiting, diarrhea, bloody stools GU:  Positives noted in HPI; otherwise negative for gross hematuria, dysuria, urinary incontinence Neuro:  Negative for seizures, poor balance, limb weakness, slurred speech Psych:  Negative for lack of energy, depression, anxiety Endocrine:  Negative for polydipsia, polyuria, symptoms of hypoglycemia (dizziness, hunger, sweating) Hematologic:  Negative for anemia, purpura, petechia, prolonged or excessive bleeding, use of anticoagulants  Allergic:  Negative for difficulty  breathing or choking as a result of exposure to anything; no shellfish allergy; no allergic response (rash/itch) to materials, foods  Physical exam: BP (!) 166/93   Pulse 80   Ht  (1.854 m)   Wt 280 lb (127 kg)   BMI 36.94 kg/m  GENERAL APPEARANCE:  Well appearing, well developed, well nourished, NAD

## 2022-09-05 ENCOUNTER — Other Ambulatory Visit: Payer: Self-pay | Admitting: Urology

## 2022-09-05 ENCOUNTER — Telehealth: Payer: Self-pay | Admitting: Urology

## 2022-09-05 MED ORDER — TADALAFIL 20 MG PO TABS: 20.0000 mg | ORAL_TABLET | ORAL | 5 refills | Status: DC | PRN

## 2022-09-05 NOTE — Telephone Encounter (Signed)
Pt aware Rx was sent to [facility].

## 2022-09-05 NOTE — Telephone Encounter (Signed)
Patient called and stated he was given a RX for Viagra and that is not working for him and wondered if he could be put back on Cialis? Patient said either pharmacy listed below is fine.   Patients callback #: [phone]  [facility] [id number] - [place], [place] - [address] Phone: [phone]  Fax: [phone]      [id number] [place], [place] - [address] AT [facility] [address] [facility] [address] [facility] [address] Phone: [phone]  Fax: [phone]

## 2022-10-02 DIAGNOSIS — M546 Pain in thoracic spine: Secondary | ICD-10-CM | POA: Diagnosis not present

## 2022-10-02 DIAGNOSIS — R0789 Other chest pain: Secondary | ICD-10-CM | POA: Diagnosis not present

## 2022-11-04 ENCOUNTER — Other Ambulatory Visit: Payer: Self-pay | Admitting: Family Medicine

## 2022-12-20 ENCOUNTER — Telehealth: Payer: Self-pay | Admitting: Urology

## 2022-12-20 NOTE — Telephone Encounter (Signed)
Calling to see if there is something that can be done about his ED medication or if there is another option for him.

## 2023-01-04 ENCOUNTER — Ambulatory Visit: Payer: Medicare Other | Admitting: Urology

## 2023-01-04 ENCOUNTER — Encounter: Payer: Self-pay | Admitting: Urology

## 2023-01-04 VITALS — BP 142/86 | HR 80 | Ht 72.0 in | Wt 275.0 lb

## 2023-01-04 DIAGNOSIS — N529 Male erectile dysfunction, unspecified: Secondary | ICD-10-CM | POA: Diagnosis not present

## 2023-01-04 NOTE — Progress Notes (Signed)
   Assessment: 1. Erectile dysfunction of organic origin     Plan: Today I had a long discussion with the patient regarding his erectile dysfunction and reviewed treatment options using a goal oriented approach.  We discussed vacuum erection devices, penile injection therapy, as well as penile implant surgery in detail.  The patient had numerous questions which I answered.  Following our discussion he wants to consider his options and discuss further with his wife and will let us know how he wants to proceed.  Chief Complaint: ED  HPI: Russell Sanford is a 67 y.o. male who presents for continued evaluation of erectile dysfunction.  Please see my note 08/08/2018 for the time of initial visit for detailed history.  Since that visit the patient has tried both full dose sildenafil as well as Cialis with suboptimal results.  He oftentimes obtains a rigid erection but loses it fairly rapidly with intercourse.   Portions of the above documentation were copied from a prior visit for review purposes only.  Allergies: Allergies  Allergen Reactions   Sulfonamide Derivatives Rash    PMH: Past Medical History:  Diagnosis Date   Arthritis    Barrett's esophagus    DVT (deep venous thrombosis) (HCC)    a. Posterior tibial vein DVT 05/2012 following L TKA.   Dysrhythmia    GERD (gastroesophageal reflux disease)    HH (hiatus hernia)    Hypercholesteremia    Pulmonary embolism (HCC)    a. Large burden bilat PE on CTA 05/2012 following L TKA. F/U CT 07/02/12 showed recanalization of the vessels involved with PE    PSH: Past Surgical History:  Procedure Laterality Date   FRACTURE SURGERY     left lower leg   KNEE ARTHROSCOPY     bilateral   LASIK     SHOULDER ARTHROSCOPY     bilateral   TOE AMPUTATION     left big toe   TOTAL KNEE ARTHROPLASTY  05/17/2012   Procedure: TOTAL KNEE ARTHROPLASTY;  Surgeon: Eugenia Mcalpine, MD;  Location: [facility];  Service: Orthopedics;  Laterality: Left;    TOTAL KNEE ARTHROPLASTY Right    03/2021   TOTAL SHOULDER ARTHROPLASTY Left 08/29/2012   Procedure: LEFT SHOULDER TOTAL ARTHROPLASTY;  Surgeon: Senaida Lange, MD;  Location: [facility];  Service: Orthopedics;  Laterality: Left;   TOTAL SHOULDER REPLACEMENT Left 08/29/2012   Dr Rennis Chris    SH: Social History   Tobacco Use   Smoking status: Never   Smokeless tobacco: Never  Vaping Use   Vaping status: Never Used  Substance Use Topics   Alcohol use: Not Currently    Comment: beers occasionally   Drug use: No    ROS: Constitutional:  Negative for fever, chills, weight loss CV: Negative for chest pain, previous MI, hypertension Respiratory:  Negative for shortness of breath, wheezing, sleep apnea, frequent cough GI:  Negative for nausea, vomiting, bloody stool, GERD  PE: BP (!) 142/86   Pulse 80   Ht 6' (1.829 m)   Wt 275 lb (124.7 kg)   BMI 37.30 kg/m  GENERAL APPEARANCE:  Well appearing, well developed, well nourished, NAD

## 2023-01-29 DIAGNOSIS — H524 Presbyopia: Secondary | ICD-10-CM | POA: Diagnosis not present

## 2023-01-29 DIAGNOSIS — D3132 Benign neoplasm of left choroid: Secondary | ICD-10-CM | POA: Diagnosis not present

## 2023-03-14 ENCOUNTER — Ambulatory Visit: Payer: Medicare Other | Admitting: Family Medicine

## 2023-04-09 ENCOUNTER — Ambulatory Visit (INDEPENDENT_AMBULATORY_CARE_PROVIDER_SITE_OTHER): Payer: Medicare Other | Admitting: Family Medicine

## 2023-04-09 ENCOUNTER — Encounter: Payer: Self-pay | Admitting: Family Medicine

## 2023-04-09 VITALS — BP 144/88 | HR 74 | Ht 72.25 in | Wt 297.4 lb

## 2023-04-09 DIAGNOSIS — Z8249 Family history of ischemic heart disease and other diseases of the circulatory system: Secondary | ICD-10-CM

## 2023-04-09 DIAGNOSIS — Z23 Encounter for immunization: Secondary | ICD-10-CM

## 2023-04-09 DIAGNOSIS — K2270 Barrett's esophagus without dysplasia: Secondary | ICD-10-CM | POA: Diagnosis not present

## 2023-04-09 DIAGNOSIS — Z0000 Encounter for general adult medical examination without abnormal findings: Secondary | ICD-10-CM | POA: Diagnosis not present

## 2023-04-09 DIAGNOSIS — K219 Gastro-esophageal reflux disease without esophagitis: Secondary | ICD-10-CM

## 2023-04-09 DIAGNOSIS — E782 Mixed hyperlipidemia: Secondary | ICD-10-CM

## 2023-04-09 DIAGNOSIS — Z86711 Personal history of pulmonary embolism: Secondary | ICD-10-CM | POA: Diagnosis not present

## 2023-04-09 DIAGNOSIS — M1990 Unspecified osteoarthritis, unspecified site: Secondary | ICD-10-CM

## 2023-04-09 DIAGNOSIS — Z96651 Presence of right artificial knee joint: Secondary | ICD-10-CM

## 2023-04-09 DIAGNOSIS — I440 Atrioventricular block, first degree: Secondary | ICD-10-CM | POA: Diagnosis not present

## 2023-04-09 DIAGNOSIS — N5201 Erectile dysfunction due to arterial insufficiency: Secondary | ICD-10-CM | POA: Diagnosis not present

## 2023-04-09 MED ORDER — TADALAFIL 20 MG PO TABS: 20.0000 mg | ORAL_TABLET | Freq: Every day | ORAL | 3 refills | Status: DC

## 2023-04-09 MED ORDER — OMEPRAZOLE MAGNESIUM 20 MG PO TBEC
20.0000 mg | DELAYED_RELEASE_TABLET | Freq: Every morning | ORAL | 3 refills | Status: DC
Start: 2023-04-09 — End: 2023-10-08

## 2023-04-09 NOTE — Progress Notes (Signed)
Russell Sanford is a 68 y.o. male who presents for annual wellness visit,CPE and follow-up on chronic medical conditions.  He has no particular concerns or complaints.  He is now in the process of getting a divorce and is stressed because of this but feels that this is necessary.  He has not been taking his Lipitor.  He does have a history of Barrett's esophagus and has not been using Prilosec regularly.  He would like a refill on his Cialis.  He does have underlying arthritis and has had TKR.  He seems to be doing well with that.  He also had a PE based on that.  He has had a Cologuard within the last 3 years.  He still drinks 2 beers and does recognize that this is also interfering with his ED.  He has had no difficulty with chest pain, shortness of breath etc.  He does plan on continuing to work  Immunizations and Health Maintenance Immunization History  Administered Date(s) Administered   Fluad Quad(high Dose 65+) 02/13/2020, 02/14/2021, 02/22/2022   Fluad Trivalent(High Dose 65+) 04/09/2023   Influenza Split 02/24/2013, 02/07/2014   Influenza, Seasonal, Injecte, Preservative Fre 03/13/2012   Influenza,inj,Quad PF,6+ Mos 02/22/2015   PFIZER(Purple Top)SARS-COV-2 Vaccination 07/25/2019, 08/18/2019, 02/13/2020   Pfizer Covid-19 Vaccine Bivalent Booster 79yrs & up 02/14/2021   Pfizer(Comirnaty)Fall Seasonal Vaccine 12 years and older 02/22/2022, 04/09/2023   Pneumococcal Conjugate-13 02/13/2020   Pneumococcal Polysaccharide-23 02/14/2021   Tdap 03/13/2012   Health Maintenance Due  Topic Date Due   Zoster Vaccines- Shingrix (1 of 2) Never done   DTaP/Tdap/Td (2 - Td or Tdap) 03/13/2022    Last colonoscopy: January 2012 has had recent Cologuard. Last PSA: unknown  Dentist: Hasn't been in over a year but is seeking regular dental care in [place]  Ophtho:  Exercise: walk for an hour at least 2-3 times a week and physically demanding job  Other doctors caring for patient include: none    Advanced Directives: Has a living will but directives are changing.     Depression screen:  See questionnaire below.        04/09/2023   11:49 AM 02/22/2022    9:15 AM 02/17/2022   10:07 AM 02/14/2021   10:30 AM 02/13/2020    9:20 AM  Depression screen PHQ 2/9  Decreased Interest 0 0 0 0 0  Down, Depressed, Hopeless 0 0 0 0 0  PHQ - 2 Score 0 0 0 0 0  Altered sleeping   0    Tired, decreased energy   0    Change in appetite   0    Feeling bad or failure about yourself    0    Trouble concentrating   0    Moving slowly or fidgety/restless   0    Suicidal thoughts   0    PHQ-9 Score   0    Difficult doing work/chores   Not difficult at all      Fall Screen: See Questionaire below.      04/09/2023   11:49 AM 02/22/2022    9:15 AM 02/17/2022   10:06 AM 02/14/2021   10:29 AM 02/13/2020    9:20 AM  Fall Risk   Falls in the past year? 0 0 0 0 0  Number falls in past yr: 0 0 0 0   Injury with Fall? 0 0 0 0   Risk for fall due to :  No Fall Risks No Fall Risks No  Fall Risks No Fall Risks  Follow up  Falls evaluation completed Falls prevention discussed;Education provided;Falls evaluation completed Falls evaluation completed     ADL screen:  See questionnaire below.  Functional Status Survey: Is the patient deaf or have difficulty hearing?: No Does the patient have difficulty seeing, even when wearing glasses/contacts?: No Does the patient have difficulty concentrating, remembering, or making decisions?: No Does the patient have difficulty walking or climbing stairs?: No Does the patient have difficulty dressing or bathing?: No Does the patient have difficulty doing errands alone such as visiting a doctor's office or shopping?: No   Review of Systems  Constitutional: -, -unexpected weight change, -anorexia, -fatigue Allergy: -sneezing, -itching, -congestion Dermatology: denies changing moles, rash, lumps ENT: -runny nose, -ear pain, -sore throat,  Cardiology:  -chest  pain, -palpitations, -orthopnea, Respiratory: -cough, -shortness of breath, -dyspnea on exertion, -wheezing,  Gastroenterology: -abdominal pain, -nausea, -vomiting, -diarrhea, -constipation, -dysphagia Hematology: -bleeding or bruising problems Musculoskeletal: -arthralgias, -myalgias, -joint swelling, -back pain, - Ophthalmology: -vision changes,  Urology: -dysuria, -difficulty urinating,  -urinary frequency, -urgency, incontinence Neurology: -, -numbness, , -memory loss, -falls, -dizziness    PHYSICAL EXAM:  BP (!) 144/88   Pulse 74   Ht 6' 0.25" (1.835 m)   Wt 297 lb 6.4 oz (134.9 kg)   SpO2 94%   BMI 40.06 kg/m   General Appearance: Alert, cooperative, no distress, appears stated age Head: Normocephalic, without obvious abnormality, atraumatic Eyes: PERRL, conjunctiva/corneas clear, EOM's intact,  Ears: Normal TM's and external ear canals Nose: Nares normal, mucosa normal, no drainage or sinus   tenderness Throat: Lips, mucosa, and tongue normal; teeth and gums normal Neck: Supple, no lymphadenopathy, thyroid:no enlargement/tenderness/nodules; no carotid bruit or JVD Lungs: Clear to auscultation bilaterally without wheezes, rales or ronchi; respirations unlabored Heart: Regular rate and rhythm, S1 and S2 normal, no murmur, rub or gallop Abdomen: Soft, non-tender, nondistended, normoactive bowel sounds, no masses, no hepatosplenomegaly Skin: Skin color, texture, turgor normal, no rashes or lesions Lymph nodes: Cervical, supraclavicular, and axillary nodes normal Neurologic: CNII-XII intact, normal strength, sensation and gait; reflexes 2+ and symmetric throughout   Psych: Normal mood, affect, hygiene and grooming  ASSESSMENT/PLAN: Routine general medical examination at a health care facility - Plan: CBC with Differential/Platelet, Comprehensive metabolic panel, Lipid panel  Arthritis  Barrett's esophagus without dysplasia - Plan: omeprazole (PRILOSEC OTC) 20 MG  tablet  Erectile dysfunction due to arterial insufficiency - Plan: tadalafil (CIALIS) 20 MG tablet  Family history of heart disease in male family member before age 23 - Plan: Lipid panel  First degree heart block  Gastroesophageal reflux disease without esophagitis - Plan: omeprazole (PRILOSEC OTC) 20 MG tablet  History of pulmonary embolus (PE)  Hx of total knee arthroplasty, right  Mixed hyperlipidemia - Plan: Lipid panel  Need for influenza vaccination - Plan: Flu Vaccine Trivalent High Dose (Fluad)  Need for COVID-19 vaccine - Plan: Pfizer Comirnaty Covid -19 Vaccine 4yrs and older  Discussed the stress that he is out in her in regard to the legal battles.  He seems to have a good handle on this and also explained that it is going to take longer than he would really like.  I will adjust his statin based on the blood work.   Immunization recommendations discussed.  Colonoscopy recommendations reviewed.   Medicare Attestation I have personally reviewed: The patient's medical and social history Their use of alcohol, tobacco or illicit drugs Their current medications and supplements The patient's functional ability including ADLs,fall  risks, home safety risks, cognitive, and hearing and visual impairment Diet and physical activities Evidence for depression or mood disorders  The patient's weight, height, and BMI have been recorded in the chart.  I have made referrals, counseling, and provided education to the patient based on review of the above and I have provided the patient with a written personalized care plan for preventive services.     Sharlot Gowda, MD   04/09/2023

## 2023-04-09 NOTE — Addendum Note (Signed)
Addended by: Ronnald Nian on: 04/09/2023 01:49 PM   Modules accepted: Level of Service

## 2023-04-10 LAB — CBC WITH DIFFERENTIAL/PLATELET
Basophils Absolute: 0 10*3/uL (ref 0.0–0.2)
Basos: 0 %
EOS (ABSOLUTE): 0.1 10*3/uL (ref 0.0–0.4)
Eos: 2 %
Hematocrit: 44.7 % (ref 37.5–51.0)
Hemoglobin: 15.9 g/dL (ref 13.0–17.7)
Immature Grans (Abs): 0 10*3/uL (ref 0.0–0.1)
Immature Granulocytes: 0 %
Lymphocytes Absolute: 1.2 10*3/uL (ref 0.7–3.1)
Lymphs: 23 %
MCH: 34.2 pg — ABNORMAL HIGH (ref 26.6–33.0)
MCHC: 35.6 g/dL (ref 31.5–35.7)
MCV: 96 fL (ref 79–97)
Monocytes Absolute: 0.8 10*3/uL (ref 0.1–0.9)
Monocytes: 15 %
Neutrophils Absolute: 3.3 10*3/uL (ref 1.4–7.0)
Neutrophils: 60 %
Platelets: 208 10*3/uL (ref 150–450)
RBC: 4.65 x10E6/uL (ref 4.14–5.80)
RDW: 12.3 % (ref 11.6–15.4)
WBC: 5.4 10*3/uL (ref 3.4–10.8)

## 2023-04-10 LAB — COMPREHENSIVE METABOLIC PANEL
ALT: 44 [IU]/L (ref 0–44)
AST: 37 [IU]/L (ref 0–40)
Albumin: 4.4 g/dL (ref 3.9–4.9)
Alkaline Phosphatase: 92 [IU]/L (ref 44–121)
BUN/Creatinine Ratio: 14 (ref 10–24)
BUN: 11 mg/dL (ref 8–27)
Bilirubin Total: 0.9 mg/dL (ref 0.0–1.2)
CO2: 24 mmol/L (ref 20–29)
Calcium: 9.3 mg/dL (ref 8.6–10.2)
Chloride: 102 mmol/L (ref 96–106)
Creatinine, Ser: 0.77 mg/dL (ref 0.76–1.27)
Globulin, Total: 3 g/dL (ref 1.5–4.5)
Glucose: 100 mg/dL — ABNORMAL HIGH (ref 70–99)
Potassium: 4.6 mmol/L (ref 3.5–5.2)
Sodium: 141 mmol/L (ref 134–144)
Total Protein: 7.4 g/dL (ref 6.0–8.5)
eGFR: 97 mL/min/{1.73_m2} (ref >59)

## 2023-04-10 LAB — LIPID PANEL
Chol/HDL Ratio: 3.6 {ratio} (ref 0.0–5.0)
Cholesterol, Total: 223 mg/dL — ABNORMAL HIGH (ref 100–199)
HDL: 62 mg/dL (ref >39)
LDL Chol Calc (NIH): 135 mg/dL — ABNORMAL HIGH (ref 0–99)
Triglycerides: 146 mg/dL (ref 0–149)
VLDL Cholesterol Cal: 26 mg/dL (ref 5–40)

## 2023-04-11 MED ORDER — ROSUVASTATIN CALCIUM 40 MG PO TABS: 40.0000 mg | ORAL_TABLET | Freq: Every day | ORAL | 3 refills | Status: DC

## 2023-04-11 NOTE — Addendum Note (Signed)
Addended by: Ronnald Nian on: 04/11/2023 07:18 PM   Modules accepted: Orders

## 2023-04-12 ENCOUNTER — Other Ambulatory Visit: Payer: Self-pay

## 2023-04-12 DIAGNOSIS — E782 Mixed hyperlipidemia: Secondary | ICD-10-CM

## 2023-04-12 MED ORDER — ROSUVASTATIN CALCIUM 40 MG PO TABS: 40.0000 mg | ORAL_TABLET | Freq: Every day | ORAL | 3 refills | Status: DC

## 2023-05-28 DIAGNOSIS — R002 Palpitations: Secondary | ICD-10-CM | POA: Diagnosis not present

## 2023-05-28 DIAGNOSIS — I4892 Unspecified atrial flutter: Secondary | ICD-10-CM | POA: Diagnosis not present

## 2023-05-28 DIAGNOSIS — Z882 Allergy status to sulfonamides status: Secondary | ICD-10-CM | POA: Diagnosis not present

## 2023-05-28 DIAGNOSIS — M5432 Sciatica, left side: Secondary | ICD-10-CM | POA: Diagnosis not present

## 2023-05-28 DIAGNOSIS — Z951 Presence of aortocoronary bypass graft: Secondary | ICD-10-CM | POA: Diagnosis not present

## 2023-05-30 DIAGNOSIS — I480 Paroxysmal atrial fibrillation: Secondary | ICD-10-CM | POA: Diagnosis not present

## 2023-05-30 DIAGNOSIS — I4430 Unspecified atrioventricular block: Secondary | ICD-10-CM | POA: Diagnosis not present

## 2023-05-30 DIAGNOSIS — G4733 Obstructive sleep apnea (adult) (pediatric): Secondary | ICD-10-CM | POA: Diagnosis not present

## 2023-05-30 DIAGNOSIS — I4892 Unspecified atrial flutter: Secondary | ICD-10-CM | POA: Diagnosis not present

## 2023-06-13 DIAGNOSIS — Z01810 Encounter for preprocedural cardiovascular examination: Secondary | ICD-10-CM | POA: Diagnosis not present

## 2023-06-13 DIAGNOSIS — I480 Paroxysmal atrial fibrillation: Secondary | ICD-10-CM | POA: Diagnosis not present

## 2023-06-13 DIAGNOSIS — I77810 Thoracic aortic ectasia: Secondary | ICD-10-CM | POA: Diagnosis not present

## 2023-06-13 DIAGNOSIS — I868 Varicose veins of other specified sites: Secondary | ICD-10-CM | POA: Diagnosis not present

## 2023-06-15 DIAGNOSIS — Z86711 Personal history of pulmonary embolism: Secondary | ICD-10-CM | POA: Diagnosis not present

## 2023-06-15 DIAGNOSIS — Z8673 Personal history of transient ischemic attack (TIA), and cerebral infarction without residual deficits: Secondary | ICD-10-CM | POA: Diagnosis not present

## 2023-06-15 DIAGNOSIS — I4891 Unspecified atrial fibrillation: Secondary | ICD-10-CM | POA: Diagnosis not present

## 2023-06-15 DIAGNOSIS — Z79899 Other long term (current) drug therapy: Secondary | ICD-10-CM | POA: Diagnosis not present

## 2023-06-15 DIAGNOSIS — I4892 Unspecified atrial flutter: Secondary | ICD-10-CM | POA: Diagnosis not present

## 2023-06-15 DIAGNOSIS — I483 Typical atrial flutter: Secondary | ICD-10-CM | POA: Diagnosis not present

## 2023-06-15 DIAGNOSIS — I3139 Other pericardial effusion (noninflammatory): Secondary | ICD-10-CM | POA: Diagnosis not present

## 2023-06-15 DIAGNOSIS — G4733 Obstructive sleep apnea (adult) (pediatric): Secondary | ICD-10-CM | POA: Diagnosis not present

## 2023-06-15 DIAGNOSIS — I498 Other specified cardiac arrhythmias: Secondary | ICD-10-CM | POA: Diagnosis not present

## 2023-06-15 DIAGNOSIS — E785 Hyperlipidemia, unspecified: Secondary | ICD-10-CM | POA: Diagnosis not present

## 2023-06-15 DIAGNOSIS — Z8719 Personal history of other diseases of the digestive system: Secondary | ICD-10-CM | POA: Diagnosis not present

## 2023-06-15 DIAGNOSIS — R600 Localized edema: Secondary | ICD-10-CM | POA: Diagnosis not present

## 2023-06-15 DIAGNOSIS — Z7901 Long term (current) use of anticoagulants: Secondary | ICD-10-CM | POA: Diagnosis not present

## 2023-06-15 DIAGNOSIS — K219 Gastro-esophageal reflux disease without esophagitis: Secondary | ICD-10-CM | POA: Diagnosis not present

## 2023-06-15 DIAGNOSIS — I480 Paroxysmal atrial fibrillation: Secondary | ICD-10-CM | POA: Diagnosis not present

## 2023-06-15 DIAGNOSIS — Z882 Allergy status to sulfonamides status: Secondary | ICD-10-CM | POA: Diagnosis not present

## 2023-06-27 DIAGNOSIS — G4733 Obstructive sleep apnea (adult) (pediatric): Secondary | ICD-10-CM | POA: Diagnosis not present

## 2023-07-16 DIAGNOSIS — G4733 Obstructive sleep apnea (adult) (pediatric): Secondary | ICD-10-CM | POA: Diagnosis not present

## 2023-07-31 DIAGNOSIS — I480 Paroxysmal atrial fibrillation: Secondary | ICD-10-CM | POA: Diagnosis not present

## 2023-09-30 DIAGNOSIS — G4733 Obstructive sleep apnea (adult) (pediatric): Secondary | ICD-10-CM | POA: Diagnosis not present

## 2023-09-30 DIAGNOSIS — I483 Typical atrial flutter: Secondary | ICD-10-CM | POA: Diagnosis not present

## 2023-09-30 DIAGNOSIS — Z95818 Presence of other cardiac implants and grafts: Secondary | ICD-10-CM | POA: Diagnosis not present

## 2023-10-06 ENCOUNTER — Other Ambulatory Visit: Payer: Self-pay | Admitting: Family Medicine

## 2023-10-06 DIAGNOSIS — K2270 Barrett's esophagus without dysplasia: Secondary | ICD-10-CM

## 2023-10-06 DIAGNOSIS — K219 Gastro-esophageal reflux disease without esophagitis: Secondary | ICD-10-CM

## 2023-10-31 DIAGNOSIS — G4733 Obstructive sleep apnea (adult) (pediatric): Secondary | ICD-10-CM | POA: Diagnosis not present

## 2023-10-31 DIAGNOSIS — I483 Typical atrial flutter: Secondary | ICD-10-CM | POA: Diagnosis not present

## 2023-10-31 DIAGNOSIS — Z4509 Encounter for adjustment and management of other cardiac device: Secondary | ICD-10-CM | POA: Diagnosis not present

## 2023-12-01 DIAGNOSIS — I483 Typical atrial flutter: Secondary | ICD-10-CM | POA: Diagnosis not present

## 2023-12-01 DIAGNOSIS — G4733 Obstructive sleep apnea (adult) (pediatric): Secondary | ICD-10-CM | POA: Diagnosis not present

## 2023-12-01 DIAGNOSIS — Z4509 Encounter for adjustment and management of other cardiac device: Secondary | ICD-10-CM | POA: Diagnosis not present

## 2024-01-01 DIAGNOSIS — Z4509 Encounter for adjustment and management of other cardiac device: Secondary | ICD-10-CM | POA: Diagnosis not present

## 2024-01-01 DIAGNOSIS — G4733 Obstructive sleep apnea (adult) (pediatric): Secondary | ICD-10-CM | POA: Diagnosis not present

## 2024-01-01 DIAGNOSIS — I483 Typical atrial flutter: Secondary | ICD-10-CM | POA: Diagnosis not present

## 2024-01-25 ENCOUNTER — Other Ambulatory Visit: Payer: Self-pay | Admitting: Family Medicine

## 2024-01-25 DIAGNOSIS — K2270 Barrett's esophagus without dysplasia: Secondary | ICD-10-CM

## 2024-01-25 DIAGNOSIS — K219 Gastro-esophageal reflux disease without esophagitis: Secondary | ICD-10-CM

## 2024-04-10 ENCOUNTER — Other Ambulatory Visit: Payer: Self-pay | Admitting: Family Medicine

## 2024-04-10 DIAGNOSIS — E782 Mixed hyperlipidemia: Secondary | ICD-10-CM

## 2024-04-24 ENCOUNTER — Ambulatory Visit (INDEPENDENT_AMBULATORY_CARE_PROVIDER_SITE_OTHER): Payer: Medicare Other | Admitting: Family Medicine

## 2024-04-24 ENCOUNTER — Encounter: Payer: Self-pay | Admitting: Family Medicine

## 2024-04-24 VITALS — BP 142/88 | HR 67 | Ht 73.0 in | Wt 296.2 lb

## 2024-04-24 DIAGNOSIS — G4733 Obstructive sleep apnea (adult) (pediatric): Secondary | ICD-10-CM | POA: Diagnosis not present

## 2024-04-24 DIAGNOSIS — Z1211 Encounter for screening for malignant neoplasm of colon: Secondary | ICD-10-CM

## 2024-04-24 DIAGNOSIS — G4734 Idiopathic sleep related nonobstructive alveolar hypoventilation: Secondary | ICD-10-CM

## 2024-04-24 DIAGNOSIS — E669 Obesity, unspecified: Secondary | ICD-10-CM | POA: Diagnosis not present

## 2024-04-24 DIAGNOSIS — G4730 Sleep apnea, unspecified: Secondary | ICD-10-CM

## 2024-04-24 DIAGNOSIS — Z23 Encounter for immunization: Secondary | ICD-10-CM | POA: Diagnosis not present

## 2024-04-24 DIAGNOSIS — E349 Endocrine disorder, unspecified: Secondary | ICD-10-CM

## 2024-04-24 DIAGNOSIS — N5201 Erectile dysfunction due to arterial insufficiency: Secondary | ICD-10-CM | POA: Diagnosis not present

## 2024-04-24 DIAGNOSIS — Z0000 Encounter for general adult medical examination without abnormal findings: Secondary | ICD-10-CM | POA: Diagnosis not present

## 2024-04-24 MED ORDER — LISINOPRIL 10 MG PO TABS: 10.0000 mg | ORAL_TABLET | Freq: Every day | ORAL | 3 refills | Status: AC

## 2024-04-24 MED ORDER — TADALAFIL 5 MG PO TABS: 5.0000 mg | ORAL_TABLET | Freq: Every day | ORAL | 1 refills | Status: AC

## 2024-04-24 MED ORDER — ZEPBOUND 2.5 MG/0.5ML ~~LOC~~ SOAJ: 2.5000 mg | SUBCUTANEOUS | 0 refills | Status: AC

## 2024-04-24 NOTE — Progress Notes (Signed)
 "  Chief Complaint  Patient presents with   Annual Exam    Cpe, awv.      Subjective:   Russell Sanford is a 68 y.o. male who presents for a Medicare Annual Wellness Visit.  Visit info / Clinical Intake: Medicare Wellness Visit Type:: Subsequent Annual Wellness Visit Persons participating in visit and providing information:: patient Medicare Wellness Visit Mode:: In-person (required for WTM) Interpreter Needed?: No Pre-visit prep was completed: no AWV questionnaire completed by patient prior to visit?: no Living arrangements:: lives with spouse/significant other Patient's Overall Health Status Rating: excellent Typical amount of pain: none Does pain affect daily life?: no Are you currently prescribed opioids?: no  Dietary Habits and Nutritional Risks How many meals a day?: 2 Eats fruit and vegetables daily?: yes Most meals are obtained by: preparing own meals; eating out In the last 2 weeks, have you had any of the following?: none Diabetic:: no  Functional Status Activities of Daily Living (to include ambulation/medication): Independent Ambulation: Independent Medication Administration: Independent Home Management (perform basic housework or laundry): Independent Manage your own finances?: yes Primary transportation is: driving Concerns about vision?: no *vision screening is required for WTM* Concerns about hearing?: no  Fall Screening Falls in the past year?: 0 Number of falls in past year: 0 Was there an injury with Fall?: 0 Fall Risk Category Calculator: 0 Patient Fall Risk Level: Low Fall Risk  Fall Risk Patient at Risk for Falls Due to: No Fall Risks Fall risk Follow up: Falls evaluation completed  Home and Transportation Safety: All rugs have non-skid backing?: yes All stairs or steps have railings?: yes Grab bars in the bathtub or shower?: (!) no Have non-skid surface in bathtub or shower?: yes Good home lighting?: yes Regular seat belt use?:  yes Hospital stays in the last year:: no  Cognitive Assessment Difficulty concentrating, remembering, or making decisions? : no Will 6CIT or Mini Cog be Completed: yes What year is it?: 0 points What month is it?: 0 points Give patient an address phrase to remember (5 components): [address] About what time is it?: 0 points Count backwards from 20 to 1: 0 points Say the months of the year in reverse: 0 points Repeat the address phrase from earlier: 0 points 6 CIT Score: 0 points  Advance Directives (For Healthcare) Does Patient Have a Medical Advance Directive?: Yes Does patient want to make changes to medical advance directive?: No - Patient declined Type of Advance Directive: Healthcare Power of [place]; Living will Copy of Healthcare Power of Attorney in Chart?: No - copy requested Copy of Living Will in Chart?: No - copy requested  Reviewed/Updated  Reviewed/Updated: Reviewed All (Medical, Surgical, Family, Medications, Allergies, Care Teams, Patient Goals)    Allergies (verified) Sulfonamide derivatives   Current Medications (verified) Outpatient Encounter Medications as of 04/24/2024  Medication Sig   lisinopril  (ZESTRIL ) 10 MG tablet Take 1 tablet (10 mg total) by mouth daily.   omeprazole  (PRILOSEC ) 20 MG capsule TAKE 1 CAPSULE BY MOUTH ONCE WEEKLY IN THE MORNING   rivaroxaban  (XARELTO ) 10 MG TABS tablet Take 10 mg by mouth daily.   rosuvastatin  (CRESTOR ) 40 MG tablet TAKE 1 TABLET(40 MG) BY MOUTH DAILY   tirzepatide  (ZEPBOUND ) 2.5 MG/0.5ML Pen Inject 2.5 mg into the skin once a week.   tadalafil  (CIALIS ) 5 MG tablet Take 1 tablet (5 mg total) by mouth daily.   [DISCONTINUED] tadalafil  (CIALIS ) 20 MG tablet Take 1 tablet (20 mg total) by mouth daily. (  Patient not taking: Reported on 04/24/2024)   No facility-administered encounter medications on file as of 04/24/2024.    History: Past Medical History:  Diagnosis Date   Arthritis    Barrett's esophagus    DVT (deep  venous thrombosis) (HCC)    a. Posterior tibial vein DVT 05/2012 following L TKA.   Dysrhythmia    GERD (gastroesophageal reflux disease)    HH (hiatus hernia)    Hypercholesteremia    Pulmonary embolism (HCC)    a. Large burden bilat PE on CTA 05/2012 following L TKA. F/U CT 07/02/12 showed recanalization of the vessels involved with PE   Past Surgical History:  Procedure Laterality Date   FRACTURE SURGERY     left lower leg   KNEE ARTHROSCOPY     bilateral   LASIK     SHOULDER ARTHROSCOPY     bilateral   TOE AMPUTATION     left big toe   TOTAL KNEE ARTHROPLASTY  05/17/2012   Procedure: TOTAL KNEE ARTHROPLASTY;  Surgeon: Lamar Collet, MD;  Location: [facility];  Service: Orthopedics;  Laterality: Left;   TOTAL KNEE ARTHROPLASTY Right    03/2021   TOTAL SHOULDER ARTHROPLASTY Left 08/29/2012   Procedure: LEFT SHOULDER TOTAL ARTHROPLASTY;  Surgeon: Franky CHRISTELLA Pointer, MD;  Location: [facility];  Service: Orthopedics;  Laterality: Left;   TOTAL SHOULDER REPLACEMENT Left 08/29/2012   Dr Pointer   Family History  Problem Relation Age of Onset   Heart attack Father 7       Father died at age 47 of massive MI   Arrhythmia Mother        Scarlet fever -> fibrillation   Social History   Occupational History    Employer: [employer]    Comment: President of [employer]  Tobacco Use   Smoking status: Never   Smokeless tobacco: Never  Vaping Use   Vaping status: Never Used  Substance and Sexual Activity   Alcohol use: Not Currently    Comment: beers occasionally   Drug use: No   Sexual activity: Yes   Tobacco Counseling Counseling given: Not Answered  SDOH Screenings   Food Insecurity: No Food Insecurity (02/17/2022)  Transportation Needs: No Transportation Needs (02/17/2022)  Depression (PHQ2-9): Low Risk (04/24/2024)  Financial Resource Strain: Low Risk (02/17/2022)  Physical Activity: Inactive (02/17/2022)  Stress: No Stress Concern Present (02/17/2022)  Tobacco  Use: Low Risk (04/24/2024)   See flowsheets for full screening details  Depression Screen PHQ 2 & 9 Depression Scale- Over the past 2 weeks, how often have you been bothered by any of the following problems? Little interest or pleasure in doing things: 0 Feeling down, depressed, or hopeless (PHQ Adolescent also includes...irritable): 0 PHQ-2 Total Score: 0     Goals Addressed             This Visit's Progress    Patient Stated       Loose weight.              Objective:    Today's Vitals   04/24/24 1054  BP: (!) 142/88  Pulse: 67  SpO2: 97%  Weight: 296 lb 3.2 oz (134.4 kg)  Height: 6' 1 (1.854 m)   Body mass index is 39.08 kg/m.  Hearing/Vision screen No results found. Immunizations and Health Maintenance Health Maintenance  Topic Date Due   DTaP/Tdap/Td (2 - Td or Tdap) 03/13/2022   Influenza Vaccine  11/16/2023   COVID-19 Vaccine (7 - 2025-26 season) 12/17/2023  Fecal DNA (Cologuard)  02/21/2024   Zoster Vaccines- Shingrix (1 of 2) 07/23/2024 (Originally 04/04/1975)   Medicare Annual Wellness (AWV)  04/24/2025   Pneumococcal Vaccine: 50+ Years  Completed   Hepatitis C Screening  Completed   Meningococcal B Vaccine  Aged Out   Colonoscopy  Discontinued        Assessment/Plan:  This is a routine wellness examination for [place].  Patient Care Team: Joyce Norleen BROCKS, MD as PCP - General  I have personally reviewed and noted the following in the patients chart:   Medical and social history Use of alcohol, tobacco or illicit drugs  Current medications and supplements including opioid prescriptions. Functional ability and status Nutritional status Physical activity Advanced directives List of other physicians Hospitalizations, surgeries, and ER visits in previous 12 months Vitals Screenings to include cognitive, depression, and falls Referrals and appointments  Orders Placed This Encounter  Procedures   Hemoglobin A1c   Comprehensive  metabolic panel   CBC with Differential   PSA   In addition, I have reviewed and discussed with patient certain preventive protocols, quality metrics, and best practice recommendations. A written personalized care plan for preventive services as well as general preventive health recommendations were provided to patient.  >=15 minutes spent in face-to-face counseling focused on reducing cardiovascular risk.  Topics reviewed: - Assessment of individual CVD risk factors (BP, lipids, glucose, weight, activity level) - Lifestyle modifications including heart-healthy diet, sodium reduction, and increased physical activity - Smoking cessation and alcohol moderation when applicable - Importance of blood pressure control, lipid optimization, and medication adherence - Personalized risk-reduction plan discussed and agreed upon   Keyarra Rendall A Sarah-Jane Nazario, MD   04/24/2024   No follow-ups on file.  After Visit Summary: (In Person-Printed) AVS printed and given to the patient  Nurse Notes: none "

## 2024-04-24 NOTE — Progress Notes (Signed)
 "  Name: Russell Sanford   Date of Visit: 04/24/2024   Date of last visit with me: Visit date not found   CHIEF COMPLAINT:  Chief Complaint  Patient presents with   Annual Exam    Cpe, awv.        HPI:  Discussed the use of AI scribe software for clinical note transcription with the patient, who gave verbal consent to proceed.  History of Present Illness   Russell Sanford is a 69 year old male who presents with concerns about weight management and medication side effects.  He has experienced significant weight gain over the past year, now weighing close to 300 pounds. He reports excessive traveling and living in a hotel for work, and notes increased alcohol consumption during this time. He has reduced his alcohol intake, with his partner encouraging moderation. He is concerned about his weight at his age and is seeking ways to manage it. He engages in physical activity by walking on a treadmill or around a two-mile track near his hotel. He has a history of weight training but has recently focused more on bodyweight exercises like push-ups.  He experiences erectile dysfunction and previously used tadalafil , which caused headaches and dizziness, leading to discontinuation a year ago. The medication did not improve his erectile function even at a 20 mg dose. He continues to have erectile dysfunction issues and denies current use of tadalafil  due to side effects.  He is currently taking Xarelto  following a cardiac ablation performed in February of the previous year. He was advised to take it for a year and is awaiting follow-up to potentially discontinue it.  He reports a history of high blood pressure. He has not yet started any blood pressure medications but is open to trying them.  He has severe sleep apnea and uses a mouthpiece to manage it, which he finds effective. He sleeps in an elevated position to aid his breathing.         OBJECTIVE:       04/24/2024   10:52 AM  Depression  screen PHQ 2/9  Decreased Interest 0  Down, Depressed, Hopeless 0  PHQ - 2 Score 0     BP Readings from Last 3 Encounters:  04/24/24 (!) 142/88  04/09/23 (!) 144/88  01/04/23 (!) 142/86    BP (!) 142/88   Pulse 67   Ht 6' 1 (1.854 m)   Wt 296 lb 3.2 oz (134.4 kg)   SpO2 97%   BMI 39.08 kg/m    Physical Exam   VITALS: BP- 142/88      Physical Exam Constitutional:      Appearance: Normal appearance.  Neurological:     General: No focal deficit present.     Mental Status: He is alert and oriented to person, place, and time. Mental status is at baseline.     ASSESSMENT/PLAN:   Assessment & Plan Encounter for Medicare annual wellness exam  Annual physical exam  Erectile dysfunction due to arterial insufficiency  Obesity (BMI 35.0-39.9 without comorbidity)  Obesity with sleep apnea  Idiopathic sleep related nonobstructive alveolar hypoventilation  OSA (obstructive sleep apnea)  Encounter for screening colonoscopy  Flu vaccine need  Testosterone  deficiency    Assessment and Plan    Obesity Weight gain likely due to lifestyle factors, contributing to hypertension and erectile dysfunction. - Prescribed Zepbound  2.5 mg, titrate as tolerated. - Encouraged exercise: walking, weight training.  Erectile dysfunction due to arterial insufficiency Erectile dysfunction likely related  to obesity and possibly low testosterone . Previous tadalafil  use ineffective with side effects. - Prescribed tadalafil  5 mg daily. - Ordered morning testosterone  level test.  Hypertension Chronic hypertension likely exacerbated by obesity. Consistently high blood pressure readings. - Started low-dose antihypertensive medication. Lisinopril  10mg  - Encouraged weight loss to reduce medication need.  Obstructive sleep apnea Severe obstructive sleep apnea managed with mouthpiece. Weight is a contributing factor. - Continue management with mouthpiece.  General Health Maintenance Up  to date on most health maintenance. Discussed colon cancer screening and flu vaccination. - Ordered Cologuard for colon cancer screening. - Administered flu vaccine.  -Comprehensive annual physical exam completed today. Reviewed interval history, current medical issues, medications, allergies, and preventive care needs. Addressed all patient questions and concerns. Discussed lifestyle factors including diet, exercise, sleep, and stress management. Reviewed recommended age-appropriate screenings, labs, and vaccinations. Counseling provided on healthy habits and routine health maintenance. Follow-up as indicated based on findings and results.         Mayerly Kaman A. Vita MD [facility] "

## 2024-04-25 ENCOUNTER — Ambulatory Visit: Payer: Self-pay | Admitting: Family Medicine

## 2024-04-25 LAB — CBC WITH DIFFERENTIAL/PLATELET
Basophils Absolute: 0 x10E3/uL (ref 0.0–0.2)
Basos: 0 %
EOS (ABSOLUTE): 0.1 x10E3/uL (ref 0.0–0.4)
Eos: 1 %
Hematocrit: 47.7 % (ref 37.5–51.0)
Hemoglobin: 16 g/dL (ref 13.0–17.7)
Immature Grans (Abs): 0 x10E3/uL (ref 0.0–0.1)
Immature Granulocytes: 0 %
Lymphocytes Absolute: 1.5 x10E3/uL (ref 0.7–3.1)
Lymphs: 23 %
MCH: 32.5 pg (ref 26.6–33.0)
MCHC: 33.5 g/dL (ref 31.5–35.7)
MCV: 97 fL (ref 79–97)
Monocytes Absolute: 0.7 x10E3/uL (ref 0.1–0.9)
Monocytes: 11 %
Neutrophils Absolute: 4.2 x10E3/uL (ref 1.4–7.0)
Neutrophils: 65 %
Platelets: 220 x10E3/uL (ref 150–450)
RBC: 4.93 x10E6/uL (ref 4.14–5.80)
RDW: 12 % (ref 11.6–15.4)
WBC: 6.6 x10E3/uL (ref 3.4–10.8)

## 2024-04-25 LAB — HEMOGLOBIN A1C
Est. average glucose Bld gHb Est-mCnc: 117 mg/dL
Hgb A1c MFr Bld: 5.7 % — ABNORMAL HIGH (ref 4.8–5.6)

## 2024-04-25 LAB — COMPREHENSIVE METABOLIC PANEL WITH GFR
ALT: 23 IU/L (ref 0–44)
AST: 24 IU/L (ref 0–40)
Albumin: 4.2 g/dL (ref 3.9–4.9)
Alkaline Phosphatase: 84 IU/L (ref 47–123)
BUN/Creatinine Ratio: 13 (ref 10–24)
BUN: 10 mg/dL (ref 8–27)
Bilirubin Total: 0.6 mg/dL (ref 0.0–1.2)
CO2: 23 mmol/L (ref 20–29)
Calcium: 9.3 mg/dL (ref 8.6–10.2)
Chloride: 102 mmol/L (ref 96–106)
Creatinine, Ser: 0.76 mg/dL (ref 0.76–1.27)
Globulin, Total: 3.8 g/dL (ref 1.5–4.5)
Glucose: 92 mg/dL (ref 70–99)
Potassium: 4.1 mmol/L (ref 3.5–5.2)
Sodium: 138 mmol/L (ref 134–144)
Total Protein: 8 g/dL (ref 6.0–8.5)
eGFR: 97 mL/min/1.73

## 2024-04-25 LAB — PSA: Prostate Specific Ag, Serum: 0.5 ng/mL (ref 0.0–4.0)

## 2024-05-01 ENCOUNTER — Other Ambulatory Visit (HOSPITAL_COMMUNITY): Payer: Self-pay

## 2024-05-14 LAB — COLOGUARD

## 2024-05-20 ENCOUNTER — Telehealth: Payer: Self-pay

## 2024-05-20 ENCOUNTER — Other Ambulatory Visit: Payer: Self-pay

## 2024-05-20 DIAGNOSIS — G4733 Obstructive sleep apnea (adult) (pediatric): Secondary | ICD-10-CM

## 2024-05-20 DIAGNOSIS — Z1211 Encounter for screening for malignant neoplasm of colon: Secondary | ICD-10-CM

## 2024-05-20 NOTE — Telephone Encounter (Signed)
 Advised patient
